# Patient Record
Sex: Female | Born: 1960 | Race: White | Hispanic: No | Marital: Married | Smoking: Former smoker
Health system: Southern US, Community
[De-identification: ages and names within clinical notes are randomized; demographics above are authoritative.]

## PROBLEM LIST (undated history)

## (undated) DIAGNOSIS — F329 Major depressive disorder, single episode, unspecified: Secondary | ICD-10-CM

## (undated) DIAGNOSIS — F32A Depression, unspecified: Secondary | ICD-10-CM

## (undated) DIAGNOSIS — I639 Cerebral infarction, unspecified: Secondary | ICD-10-CM

## (undated) DIAGNOSIS — E785 Hyperlipidemia, unspecified: Secondary | ICD-10-CM

## (undated) DIAGNOSIS — I10 Essential (primary) hypertension: Secondary | ICD-10-CM

## (undated) HISTORY — PX: INCONTINENCE SURGERY: SHX676

## (undated) HISTORY — PX: OTHER SURGICAL HISTORY: SHX169

## (undated) HISTORY — PX: TUBAL LIGATION: SHX77

## (undated) HISTORY — DX: Cerebral infarction, unspecified: I63.9

---

## 1998-07-06 ENCOUNTER — Other Ambulatory Visit: Admission: RE | Admit: 1998-07-06 | Discharge: 1998-07-06 | Payer: Self-pay | Admitting: Obstetrics & Gynecology

## 1998-10-26 ENCOUNTER — Observation Stay (HOSPITAL_COMMUNITY): Admission: RE | Admit: 1998-10-26 | Discharge: 1998-10-27 | Payer: Self-pay | Admitting: Obstetrics & Gynecology

## 1998-10-29 ENCOUNTER — Inpatient Hospital Stay (HOSPITAL_COMMUNITY): Admission: AD | Admit: 1998-10-29 | Discharge: 1998-10-29 | Payer: Self-pay | Admitting: Obstetrics and Gynecology

## 1999-07-07 ENCOUNTER — Other Ambulatory Visit: Admission: RE | Admit: 1999-07-07 | Discharge: 1999-07-07 | Payer: Self-pay | Admitting: Obstetrics & Gynecology

## 2001-02-07 ENCOUNTER — Other Ambulatory Visit: Admission: RE | Admit: 2001-02-07 | Discharge: 2001-02-07 | Payer: Self-pay | Admitting: Obstetrics & Gynecology

## 2002-07-13 ENCOUNTER — Other Ambulatory Visit: Admission: RE | Admit: 2002-07-13 | Discharge: 2002-07-13 | Payer: Self-pay | Admitting: Obstetrics & Gynecology

## 2003-11-08 ENCOUNTER — Ambulatory Visit (HOSPITAL_COMMUNITY): Admission: RE | Admit: 2003-11-08 | Discharge: 2003-11-08 | Payer: Self-pay | Admitting: Family Medicine

## 2003-11-10 ENCOUNTER — Ambulatory Visit (HOSPITAL_COMMUNITY): Admission: RE | Admit: 2003-11-10 | Discharge: 2003-11-10 | Payer: Self-pay | Admitting: Family Medicine

## 2004-02-22 ENCOUNTER — Other Ambulatory Visit: Admission: RE | Admit: 2004-02-22 | Discharge: 2004-02-22 | Payer: Self-pay | Admitting: Obstetrics & Gynecology

## 2011-03-17 ENCOUNTER — Encounter (HOSPITAL_COMMUNITY): Payer: Self-pay | Admitting: Pharmacy Technician

## 2011-03-19 ENCOUNTER — Encounter (HOSPITAL_COMMUNITY)
Admission: RE | Admit: 2011-03-19 | Discharge: 2011-03-19 | Disposition: A | Discharge status: Home / Self Care | Payer: BC Managed Care – PPO | Source: Ambulatory Visit | Attending: Obstetrics & Gynecology | Admitting: Obstetrics & Gynecology

## 2011-03-19 ENCOUNTER — Encounter (HOSPITAL_COMMUNITY): Payer: Self-pay

## 2011-03-19 ENCOUNTER — Other Ambulatory Visit: Payer: Self-pay

## 2011-03-19 HISTORY — DX: Essential (primary) hypertension: I10

## 2011-03-19 HISTORY — DX: Depression, unspecified: F32.A

## 2011-03-19 HISTORY — DX: Major depressive disorder, single episode, unspecified: F32.9

## 2011-03-19 HISTORY — DX: Hyperlipidemia, unspecified: E78.5

## 2011-03-19 LAB — BASIC METABOLIC PANEL
CO2: 25 mEq/L (ref 19–32)
GFR calc non Af Amer: >90 mL/min (ref >90)

## 2011-03-19 LAB — CBC
HCT: 28.7 % — ABNORMAL LOW (ref 36.0–46.0)
Hemoglobin: 9.3 g/dL — ABNORMAL LOW (ref 12.0–15.0)
MCH: 29 pg (ref 26.0–34.0)
MCHC: 32.4 g/dL (ref 30.0–36.0)
MCV: 89.4 fL (ref 78.0–100.0)
Platelets: 229 10*3/uL (ref 150–400)
RBC: 3.21 MIL/uL — ABNORMAL LOW (ref 3.87–5.11)
RDW: 12.7 % (ref 11.5–15.5)
WBC: 3.6 10*3/uL — ABNORMAL LOW (ref 4.0–10.5)

## 2011-03-19 NOTE — H&P (Signed)
Kathleen Rodgers is an 50 y.o. female.   Chief Complaint: Abnormal vaginal bleeding HPI: Patient had abnormal vaginal bleeding in 2010.  Pipelle showed no hyperplasia but fragment of polyp was noted on path.  Her abnormal vaginal bleeding has recurred.  Past Medical History  Diagnosis Date  . Hypertension   . Hyperlipidemia   . Depression     no meds    Past Surgical History  Procedure Date  . Tubal ligation   . Svd     x 3  . Incontinence surgery     No family history on file. Social History:  reports that she quit smoking about 15 years ago. Her smoking use included Cigarettes. She has a 31.5 pack-year smoking history. She has never used smokeless tobacco. She reports that she does not drink alcohol or use illicit drugs.  Allergies: No Known Allergies  No current facility-administered medications on file as of .   No current outpatient prescriptions on file as of .    Results for orders placed during the hospital encounter of 03/19/11 (from the past 48 hour(s))  BASIC METABOLIC PANEL     Status: Normal   Collection Time   03/19/11 11:00 AM      Component Value Range Comment   Sodium 138  135 - 145 (mEq/L)    Potassium 4.0  3.5 - 5.1 (mEq/L)    Chloride 105  96 - 112 (mEq/L)    CO2 25  19 - 32 (mEq/L)    Glucose, Bld 96  70 - 99 (mg/dL)    BUN 11  6 - 23 (mg/dL)    Creatinine, Ser 2.13  0.50 - 1.10 (mg/dL)    Calcium 9.5  8.4 - 10.5 (mg/dL)    GFR calc non Af Amer >90  >90 (mL/min)    GFR calc Af Amer >90  >90 (mL/min)   CBC     Status: Abnormal   Collection Time   03/19/11 11:00 AM      Component Value Range Comment   WBC 3.6 (*) 4.0 - 10.5 (K/uL)    RBC 3.21 (*) 3.87 - 5.11 (MIL/uL)    Hemoglobin 9.3 (*) 12.0 - 15.0 (g/dL)    HCT 08.6 (*) 57.8 - 46.0 (%)    MCV 89.4  78.0 - 100.0 (fL)    MCH 29.0  26.0 - 34.0 (pg)    MCHC 32.4  30.0 - 36.0 (g/dL)    RDW 46.9  62.9 - 52.8 (%)    Platelets 229  150 - 400 (K/uL)    No results found.  ROS: Non  contributory  There were no vitals taken for this visit. Physical Exam Pelvic exam (vagina, vulva, cervix, uterus, adnexa) without abnormality  Ultrasound on 02 February 2011 confirmed normal adnexa, uterine cavity was normal, endometrial strip 6.7 mm, 2.6 cm left lateral myoma noted.  Assessment/Plan Recurrent abnormal perimenopausal bleeding.  Endometrial sample in 2010 suspicious for polyp.  Will proceed with hysteroscopy and D&C.  If polyp present will remove.  If no polyp will proceed with Novasure to treat persistent abnormal vaginal bleeding.    Posey Petrik D 03/19/2011, 3:27 PM

## 2011-03-19 NOTE — Pre-Procedure Instructions (Signed)
OK to see patient DOS 

## 2011-03-19 NOTE — Patient Instructions (Signed)
   Your procedure is scheduled on: Friday, Dec 7th  Enter through the [employer] [facility] at: 10:30am Pick up the phone at the desk and dial [phone] and inform us of your arrival.  Please call this number if you have any problems the morning of surgery: [phone]  Remember: Do not eat food after midnight: Thursday Do not drink clear liquids after: Thursday Take these medicines the morning of surgery with a SIP OF WATER: lisinopil  Do not wear jewelry, make-up, or FINGER nail polish Do not wear lotions, powders, perfumes or deodorant. Do not shave 48 hours prior to surgery. Do not bring valuables to the hospital.  Patients discharged on the day of surgery will not be allowed to drive home.   Home with husband Annice Pih  cell [phone].  Remember to use your hibiclens as instructed.Please shower with 1/2 bottle the evening before your surgery and the other 1/2 bottle the morning of surgery.

## 2011-03-23 ENCOUNTER — Ambulatory Visit (HOSPITAL_COMMUNITY)
Admission: RE | Admit: 2011-03-23 | Discharge: 2011-03-23 | Disposition: A | Discharge status: Home / Self Care | Payer: BC Managed Care – PPO | Source: Ambulatory Visit | Attending: Obstetrics & Gynecology | Admitting: Obstetrics & Gynecology

## 2011-03-23 ENCOUNTER — Encounter (HOSPITAL_COMMUNITY): Payer: Self-pay | Admitting: *Deleted

## 2011-03-23 ENCOUNTER — Encounter (HOSPITAL_COMMUNITY)
Admission: RE | Disposition: A | Discharge status: Home / Self Care | Payer: Self-pay | Source: Ambulatory Visit | Attending: Obstetrics & Gynecology

## 2011-03-23 ENCOUNTER — Other Ambulatory Visit: Payer: Self-pay | Admitting: Obstetrics & Gynecology

## 2011-03-23 ENCOUNTER — Encounter (HOSPITAL_COMMUNITY): Payer: Self-pay | Admitting: Anesthesiology

## 2011-03-23 ENCOUNTER — Ambulatory Visit (HOSPITAL_COMMUNITY): Payer: BC Managed Care – PPO | Admitting: Anesthesiology

## 2011-03-23 DIAGNOSIS — Z01818 Encounter for other preprocedural examination: Secondary | ICD-10-CM | POA: Insufficient documentation

## 2011-03-23 DIAGNOSIS — N949 Unspecified condition associated with female genital organs and menstrual cycle: Secondary | ICD-10-CM | POA: Insufficient documentation

## 2011-03-23 DIAGNOSIS — N938 Other specified abnormal uterine and vaginal bleeding: Secondary | ICD-10-CM | POA: Insufficient documentation

## 2011-03-23 DIAGNOSIS — Z01812 Encounter for preprocedural laboratory examination: Secondary | ICD-10-CM | POA: Insufficient documentation

## 2011-03-23 SURGERY — DILATATION & CURETTAGE/HYSTEROSCOPY WITH NOVASURE ABLATION
Anesthesia: General | Site: Vagina | Wound class: Clean Contaminated

## 2011-03-23 MED ORDER — KETOROLAC TROMETHAMINE 30 MG/ML IJ SOLN: 15.0000 mg | Freq: Once | INTRAMUSCULAR | Status: DC | PRN

## 2011-03-23 MED ORDER — DEXAMETHASONE SODIUM PHOSPHATE 10 MG/ML IJ SOLN
INTRAMUSCULAR | Status: AC
  Filled 2011-03-23: qty 1

## 2011-03-23 MED ORDER — KETOROLAC TROMETHAMINE 30 MG/ML IJ SOLN
INTRAMUSCULAR | Status: DC | PRN
  Administered 2011-03-23: 30 mg via INTRAVENOUS

## 2011-03-23 MED ORDER — ONDANSETRON HCL 4 MG/2ML IJ SOLN
INTRAMUSCULAR | Status: DC | PRN
  Administered 2011-03-23: 4 mg via INTRAVENOUS

## 2011-03-23 MED ORDER — LIDOCAINE HCL 2 % IJ SOLN
INTRAMUSCULAR | Status: DC | PRN
  Administered 2011-03-23: 10 mL

## 2011-03-23 MED ORDER — EPHEDRINE 5 MG/ML INJ
INTRAVENOUS | Status: AC
  Filled 2011-03-23: qty 10

## 2011-03-23 MED ORDER — LIDOCAINE HCL (CARDIAC) 20 MG/ML IV SOLN
INTRAVENOUS | Status: DC | PRN
  Administered 2011-03-23: 60 mg via INTRAVENOUS

## 2011-03-23 MED ORDER — ONDANSETRON HCL 4 MG/2ML IJ SOLN
INTRAMUSCULAR | Status: AC
  Filled 2011-03-23: qty 2

## 2011-03-23 MED ORDER — FENTANYL CITRATE 0.05 MG/ML IJ SOLN
INTRAMUSCULAR | Status: AC
  Filled 2011-03-23: qty 2

## 2011-03-23 MED ORDER — KETOROLAC TROMETHAMINE 60 MG/2ML IM SOLN
INTRAMUSCULAR | Status: DC | PRN
  Administered 2011-03-23: 30 mg via INTRAMUSCULAR

## 2011-03-23 MED ORDER — MIDAZOLAM HCL 2 MG/2ML IJ SOLN
INTRAMUSCULAR | Status: AC
  Filled 2011-03-23: qty 2

## 2011-03-23 MED ORDER — HYDROMORPHONE HCL PF 1 MG/ML IJ SOLN
INTRAMUSCULAR | Status: AC
  Filled 2011-03-23: qty 1

## 2011-03-23 MED ORDER — OXYCODONE-ACETAMINOPHEN 5-325 MG PO TABS: 1.0000 | ORAL_TABLET | ORAL | Status: AC | PRN

## 2011-03-23 MED ORDER — KETOROLAC TROMETHAMINE 60 MG/2ML IM SOLN
INTRAMUSCULAR | Status: AC
Start: 2011-03-23 — End: 2011-03-23
  Filled 2011-03-23: qty 2

## 2011-03-23 MED ORDER — LACTATED RINGERS IV SOLN
INTRAVENOUS | Status: DC | PRN
  Administered 2011-03-23: 3000 mL via INTRAVENOUS

## 2011-03-23 MED ORDER — PROPOFOL 10 MG/ML IV EMUL
INTRAVENOUS | Status: DC | PRN
  Administered 2011-03-23: 50 mg via INTRAVENOUS
  Administered 2011-03-23: 150 mg via INTRAVENOUS

## 2011-03-23 MED ORDER — ONDANSETRON HCL 4 MG/2ML IJ SOLN: 4.0000 mg | Freq: Once | INTRAMUSCULAR | Status: DC | PRN

## 2011-03-23 MED ORDER — EPHEDRINE SULFATE 50 MG/ML IJ SOLN
INTRAMUSCULAR | Status: DC | PRN
  Administered 2011-03-23: 10 mg via INTRAVENOUS

## 2011-03-23 MED ORDER — MEPERIDINE HCL 25 MG/ML IJ SOLN: 6.2500 mg | INTRAMUSCULAR | Status: DC | PRN

## 2011-03-23 MED ORDER — PROPOFOL 10 MG/ML IV EMUL
INTRAVENOUS | Status: AC
  Filled 2011-03-23: qty 20

## 2011-03-23 MED ORDER — LACTATED RINGERS IV SOLN
INTRAVENOUS | Status: DC
  Administered 2011-03-23 (×2): via INTRAVENOUS

## 2011-03-23 MED ORDER — FENTANYL CITRATE 0.05 MG/ML IJ SOLN: 25.0000 ug | INTRAMUSCULAR | Status: DC | PRN

## 2011-03-23 MED ORDER — FENTANYL CITRATE 0.05 MG/ML IJ SOLN
INTRAMUSCULAR | Status: DC | PRN
  Administered 2011-03-23: 100 ug via INTRAVENOUS

## 2011-03-23 MED ORDER — DEXAMETHASONE SODIUM PHOSPHATE 4 MG/ML IJ SOLN
INTRAMUSCULAR | Status: DC | PRN
  Administered 2011-03-23: 10 mg via INTRAVENOUS

## 2011-03-23 MED ORDER — MIDAZOLAM HCL 5 MG/5ML IJ SOLN
INTRAMUSCULAR | Status: DC | PRN
  Administered 2011-03-23: 2 mg via INTRAVENOUS

## 2011-03-23 MED ORDER — LIDOCAINE HCL (CARDIAC) 20 MG/ML IV SOLN
INTRAVENOUS | Status: AC
  Filled 2011-03-23: qty 5

## 2011-03-23 SURGICAL SUPPLY — 13 items
ABLATOR ENDOMETRIAL BIPOLAR (ABLATOR) ×2 IMPLANT
CATH ROBINSON RED A/P 16FR (CATHETERS) ×2 IMPLANT
CLOTH BEACON ORANGE TIMEOUT ST (SAFETY) ×2 IMPLANT
CONTAINER PREFILL 10% NBF 60ML (FORM) IMPLANT
GLOVE ECLIPSE 6.0 STRL STRAW (GLOVE) ×4 IMPLANT
GOWN PREVENTION PLUS LG XLONG (DISPOSABLE) ×4 IMPLANT
GOWN STRL REIN XL XLG (GOWN DISPOSABLE) ×2 IMPLANT
NDL SPNL 22GX3.5 QUINCKE BK (NEEDLE) ×1 IMPLANT
NEEDLE SPNL 22GX3.5 QUINCKE BK (NEEDLE) ×2 IMPLANT
PACK HYSTEROSCOPY LF (CUSTOM PROCEDURE TRAY) ×2 IMPLANT
PAD PREP 24X48 CUFFED NSTRL (MISCELLANEOUS) ×2 IMPLANT
TOWEL OR 17X24 6PK STRL BLUE (TOWEL DISPOSABLE) ×4 IMPLANT
WATER STERILE IRR 1000ML POUR (IV SOLUTION) ×2 IMPLANT

## 2011-03-23 NOTE — Anesthesia Postprocedure Evaluation (Signed)
Anesthesia Post Note  Patient: Kathleen Rodgers  Procedure(s) Performed:  DILATATION & CURETTAGE/HYSTEROSCOPY WITH NOVASURE ABLATION  Anesthesia type: General  Patient location: PACU  Post pain: Pain level controlled  Post assessment: Post-op Vital signs reviewed  Last Vitals:  Filed Vitals:   03/23/11 1300  BP:   Pulse:   Temp:   Resp: 16    Post vital signs: Reviewed  Level of consciousness: sedated  Complications: No apparent anesthesia complicationsfj

## 2011-03-23 NOTE — Transfer of Care (Signed)
Immediate Anesthesia Transfer of Care Note  Patient: Kathleen Rodgers  Procedure(s) Performed:  DILATATION & CURETTAGE/HYSTEROSCOPY WITH NOVASURE ABLATION  Patient Location: PACU  Anesthesia Type: General  Level of Consciousness: awake, alert  and oriented  Airway & Oxygen Therapy: Patient Spontanous Breathing and Patient connected to nasal cannula oxygen  Post-op Assessment: Report given to PACU RN and Post -op Vital signs reviewed and stable  Post vital signs: Reviewed and stable  Complications: No apparent anesthesia complications

## 2011-03-23 NOTE — Anesthesia Preprocedure Evaluation (Signed)
Anesthesia Evaluation  Patient identified by MRN, date of birth, ID band Patient awake    Reviewed: Allergy & Precautions, H&P , NPO status , Patient's Chart, lab work & pertinent test results  Airway Mallampati: I TM Distance: >3 FB Neck ROM: full    Dental No notable dental hx. (+) Teeth Intact   Pulmonary neg pulmonary ROS,    Pulmonary exam normal       Cardiovascular hypertension, Pt. on medications     Neuro/Psych PSYCHIATRIC DISORDERS Depression Negative Neurological ROS     GI/Hepatic negative GI ROS, Neg liver ROS,   Endo/Other  Negative Endocrine ROS  Renal/GU negative Renal ROS  Genitourinary negative   Musculoskeletal negative musculoskeletal ROS (+)   Abdominal Normal abdominal exam  (+)   Peds negative pediatric ROS (+)  Hematology negative hematology ROS (+)   Anesthesia Other Findings   Reproductive/Obstetrics negative OB ROS                           Anesthesia Physical Anesthesia Plan  ASA: II  Anesthesia Plan: General   Post-op Pain Management:    Induction: Intravenous  Airway Management Planned: LMA  Additional Equipment:   Intra-op Plan:   Post-operative Plan: Extubation in OR  Informed Consent: I have reviewed the patients History and Physical, chart, labs and discussed the procedure including the risks, benefits and alternatives for the proposed anesthesia with the patient or authorized representative who has indicated his/her understanding and acceptance.     Plan Discussed with: CRNA  Anesthesia Plan Comments:         Anesthesia Quick Evaluation

## 2011-03-23 NOTE — Anesthesia Procedure Notes (Signed)
Procedure Name: LMA Insertion Date/Time: 03/23/2011 12:19 PM Performed by: Karleen Dolphin Pre-anesthesia Checklist: Patient identified, Patient being monitored, Emergency Drugs available, Timeout performed and Suction available Patient Re-evaluated:Patient Re-evaluated prior to inductionOxygen Delivery Method: Circle System Utilized Preoxygenation: Pre-oxygenation with 100% oxygen Intubation Type: IV induction Ventilation: Mask ventilation without difficulty LMA: LMA inserted LMA Size: 4.0 Number of attempts: 1 Placement Confirmation: positive ETCO2 and breath sounds checked- equal and bilateral Tube secured with: Tape Dental Injury: Teeth and Oropharynx as per pre-operative assessment

## 2011-03-23 NOTE — Progress Notes (Signed)
I have interviewed and performed the pertinent exams on my patient to confirm that there have been no significant changes in her condition since the dictation of her history and physical exam.  

## 2011-03-23 NOTE — Op Note (Signed)
Patient Name: Kathleen Rodgers MRN: [id number]  Date of Surgery: 03/23/2011    PREOPERATIVE DIAGNOSIS: ABNORMAL UTERINE BLEEDING  POSTOPERATIVE DIAGNOSIS: Abnormal uterine bleeding, no gross endometrial pathology   PROCEDURE: Hysteroscopy, D&C, Novasure endometrial ablation  SURGEON: Caralyn Guile. Arlyce Dice M.D.  ANESTHESIA: General  ESTIMATED BLOOD LOSS: * No blood loss amount entered *  FINDINGS: Uterus was retroverted.  Cervix sounds 3.4 cm, Uterus sounds 9.0 cm.  No gross pathology (myoma, polyps) noted.  Moderate amount of endometrial currettings.   INDICATIONS: Recurrent abnormal perimenopausal bleeding.  PROCEDURE IN DETAIL: The patient was taken to the OR and placed in the dors-lithotomy position. The perineum and vagina were prepped and draped in a sterile fashion. The bladder was emptied.  Bimanual exam revealed a top normal sized retroverted uterus.  The cervix and uterus were sounded and the cavity depth was determined to be 5.5 cm.  The internal cervical os was dilated with Shawnie Pons dilators to 21 [place].  The diagnostic hysteroscope was introduced and the cavity was inspected.  A sharp curettage was performed and the tissue was sent to pathology.  The internal os was dilated further to 25 [place] and the Novasure device was inserted and deployed to a width of 4.5 cm.  Ablation time was 120 sec. At a power of 136.  The hysteroscope was reintroduced and the cavity was intact and well cauterized.  The procedure was terminated and the patient left the OR in good condition.

## 2011-12-31 ENCOUNTER — Other Ambulatory Visit: Payer: Self-pay

## 2012-07-16 ENCOUNTER — Telehealth: Payer: Self-pay | Admitting: Physician Assistant

## 2012-07-17 ENCOUNTER — Telehealth: Payer: Self-pay | Admitting: *Deleted

## 2012-07-17 NOTE — Telephone Encounter (Signed)
Pt had testing for Oxygen levels by [facility] but has not received any information from [facility] concerning results. Please call with results.

## 2012-07-17 NOTE — Telephone Encounter (Signed)
Pt will call [facility] and have them fax results of oxygen test to [place] for review.

## 2012-07-18 ENCOUNTER — Telehealth: Payer: Self-pay | Admitting: Physician Assistant

## 2012-07-22 ENCOUNTER — Other Ambulatory Visit: Payer: Self-pay | Admitting: *Deleted

## 2012-07-22 ENCOUNTER — Other Ambulatory Visit: Payer: Self-pay | Admitting: Nurse Practitioner

## 2012-07-22 DIAGNOSIS — R0902 Hypoxemia: Secondary | ICD-10-CM

## 2012-07-22 NOTE — Telephone Encounter (Signed)
Pt aware , Kathleen Rodgers and Kathleen Rodgers are addressing the orders necessary at [facility].

## 2012-07-29 ENCOUNTER — Encounter: Payer: Self-pay | Admitting: Family Medicine

## 2012-08-08 ENCOUNTER — Other Ambulatory Visit: Payer: Self-pay | Admitting: *Deleted

## 2012-08-08 MED ORDER — TELMISARTAN 40 MG PO TABS: 40.0000 mg | ORAL_TABLET | Freq: Every day | ORAL | Status: DC

## 2012-08-12 ENCOUNTER — Other Ambulatory Visit: Payer: Self-pay | Admitting: Nurse Practitioner

## 2012-08-12 DIAGNOSIS — G4710 Hypersomnia, unspecified: Secondary | ICD-10-CM

## 2012-08-12 NOTE — Progress Notes (Signed)
Pt aware of sleep apnea test being set up

## 2012-08-21 ENCOUNTER — Ambulatory Visit: Payer: BC Managed Care – PPO | Attending: Family Medicine | Admitting: Sleep Medicine

## 2012-08-21 VITALS — Ht 67.0 in | Wt 173.0 lb

## 2012-08-21 DIAGNOSIS — Z6827 Body mass index (BMI) 27.0-27.9, adult: Secondary | ICD-10-CM | POA: Insufficient documentation

## 2012-08-21 DIAGNOSIS — G4710 Hypersomnia, unspecified: Secondary | ICD-10-CM | POA: Insufficient documentation

## 2012-08-25 NOTE — Procedures (Signed)
[facility] Parveen Freehling A. Gerilyn Pilgrim, MD     [email or web address]        NAMEJAKEISHA, STRICKER               ACCOUNT NO.:  [id number]  MEDICAL RECORD NO.:  [id number]          PATIENT TYPE:  OUT  LOCATION:  SLEEP LAB                     FACILITY:  [facility]  PHYSICIAN:  Kenya Kook A. Gerilyn Pilgrim, M.D. DATE OF BIRTH:  1960/11/21  DATE OF STUDY:  08/21/2012                           NOCTURNAL POLYSOMNOGRAM  REFERRING PHYSICIAN:  Ernestina Penna, M.D.  INDICATIONS:  A 52 year old who presents with difficulty waking, hypersomnia, and daytime sleepiness.  MEDICATIONS:  Crestor and alprazolam.  EPWORTH SLEEPINESS SCALE:  8.  BMI 27.  ARCHITECTURAL SUMMARY:  The total recording time is 364 minutes.  Sleep efficiency 74%, sleep latency 18 minutes.  REM latency 97 minutes. Stage N1 8%, N2 69%, N3 7%, and REM sleep 16%.  RESPIRATORY SUMMARY:  Baseline oxygen saturation is 97, lowest saturation 92 during non-REM sleep.  Diagnostic AHI is 2 and RDI 5.  LIMB MOVEMENT SUMMARY:  PLM index 4.  ELECTROCARDIOGRAM SUMMARY:  Average heart rate is 58 with no significant dysrhythmias observed.  IMPRESSION:  Unremarkable nocturnal polysomnography.  Thanks for this referral.    Paton Crum A. Gerilyn Pilgrim, M.D.    KAD/MEDQ  D:  08/25/2012 09:29:09  T:  08/25/2012 09:52:44  Job:  [id number]

## 2012-09-18 NOTE — Telephone Encounter (Signed)
Yes received.

## 2012-11-05 ENCOUNTER — Other Ambulatory Visit: Payer: Self-pay | Admitting: Nurse Practitioner

## 2012-11-07 ENCOUNTER — Other Ambulatory Visit: Payer: Self-pay | Admitting: *Deleted

## 2012-11-07 MED ORDER — ROSUVASTATIN CALCIUM 10 MG PO TABS: 10.0000 mg | ORAL_TABLET | Freq: Every day | ORAL | Status: DC

## 2013-02-10 ENCOUNTER — Other Ambulatory Visit (INDEPENDENT_AMBULATORY_CARE_PROVIDER_SITE_OTHER): Payer: BC Managed Care – PPO | Admitting: Family Medicine

## 2013-02-10 ENCOUNTER — Encounter (INDEPENDENT_AMBULATORY_CARE_PROVIDER_SITE_OTHER): Payer: Self-pay

## 2013-02-10 ENCOUNTER — Encounter: Payer: Self-pay | Admitting: Family Medicine

## 2013-02-10 VITALS — BP 140/88 | HR 71 | Temp 97.6°F | Ht 66.0 in | Wt 181.6 lb

## 2013-02-10 DIAGNOSIS — Z79899 Other long term (current) drug therapy: Secondary | ICD-10-CM

## 2013-02-10 DIAGNOSIS — E785 Hyperlipidemia, unspecified: Secondary | ICD-10-CM

## 2013-02-10 MED ORDER — TELMISARTAN 40 MG PO TABS: 40.0000 mg | ORAL_TABLET | Freq: Every day | ORAL | Status: DC

## 2013-02-10 NOTE — Addendum Note (Signed)
Addended by: Deatra Canter on: 02/10/2013 10:24 AM   Modules accepted: Orders

## 2013-02-10 NOTE — Progress Notes (Signed)
Pt saw andrew but now willing to see Nils Pyle

## 2013-02-10 NOTE — Addendum Note (Signed)
Addended by: Bearl Mulberry on: 02/10/2013 09:53 AM   Modules accepted: Orders, Medications

## 2013-02-10 NOTE — Progress Notes (Signed)
  Subjective:    Patient ID: Kathleen Rodgers, female    DOB: 1961/02/09, 52 y.o.   MRN: [id number]  HPI This 52 y.o. female presents for evaluation of hypertension and hyperlipidemia. She is out of her meds and needs refills..   Review of Systems No chest pain, SOB, HA, dizziness, vision change, N/V, diarrhea, constipation, dysuria, urinary urgency or frequency, myalgias, arthralgias or rash.     Objective:   Physical Exam  Vital signs noted  Well developed well nourished female.  HEENT - Head atraumatic Normocephalic                Eyes - PERRLA, Conjuctiva - clear Sclera- Clear EOMI                Ears - EAC's Wnl TM's Wnl Gross Hearing WNL                Nose - Nares patent                 Throat - oropharanx wnl Respiratory - Lungs CTA bilateral Cardiac - RRR S1 and S2 without murmur GI - Abdomen soft Nontender and bowel sounds active x 4 Extremities - No edema. Neuro - Grossly intact.      Assessment & Plan:  Hypertension - renew Valsartan 40mg  po qd  Hyperlipidemia - renew crestor and follow up in 6 months  Deatra Canter FNP

## 2013-02-10 NOTE — Progress Notes (Signed)
  Subjective:    Patient ID: LEILANA MCQUIRE, female    DOB: [birth year], 52 y.o.   MRN: [id number]  HPI This 52 y.o. female presents for evaluation of hypertension.   Review of Systems No chest pain, SOB, HA, dizziness, vision change, N/V, diarrhea, constipation, dysuria, urinary urgency or frequency, myalgias, arthralgias or rash.     Objective:   Physical Exam Vital signs noted  Well developed well nourished female.  HEENT - Head atraumatic Normocephalic                Eyes - PERRLA, Conjuctiva - clear Sclera- Clear EOMI                Ears - EAC's Wnl TM's Wnl Gross Hearing WNL                Nose - Nares patent                 Throat - oropharanx wnl Respiratory - Lungs CTA bilateral Cardiac - RRR S1 and S2 without murmur GI - Abdomen soft Nontender and bowel sounds active x 4 Extremities - No edema. Neuro - Grossly intact.       Assessment & Plan:

## 2013-02-11 ENCOUNTER — Ambulatory Visit: Payer: PRIVATE HEALTH INSURANCE | Admitting: Family Medicine

## 2013-02-12 LAB — CMP14+EGFR
ALT: 19 IU/L (ref 0–32)
AST: 15 IU/L (ref 0–40)
Albumin/Globulin Ratio: 1.6 (ref 1.1–2.5)
Albumin: 4.1 g/dL (ref 3.5–5.5)
Alkaline Phosphatase: 88 IU/L (ref 39–117)
BUN/Creatinine Ratio: 18 (ref 9–23)
BUN: 14 mg/dL (ref 6–24)
CO2: 21 mmol/L (ref 18–29)
Calcium: 9.2 mg/dL (ref 8.7–10.2)
Chloride: 102 mmol/L (ref 97–108)
Creatinine, Ser: 0.78 mg/dL (ref 0.57–1.00)
GFR calc Af Amer: 100 mL/min/{1.73_m2} (ref >59)
GFR calc non Af Amer: 86 mL/min/{1.73_m2} (ref >59)
Globulin, Total: 2.5 g/dL (ref 1.5–4.5)
Glucose: 96 mg/dL (ref 65–99)
Potassium: 3.9 mmol/L (ref 3.5–5.2)
Sodium: 141 mmol/L (ref 134–144)
Total Bilirubin: 0.3 mg/dL (ref 0.0–1.2)
Total Protein: 6.6 g/dL (ref 6.0–8.5)

## 2013-02-12 LAB — NMR, LIPOPROFILE
Cholesterol: 232 mg/dL — ABNORMAL HIGH (ref <200)
HDL Cholesterol by NMR: 41 mg/dL (ref >=40)
HDL Particle Number: 26.5 umol/L — ABNORMAL LOW (ref >=30.5)
LDL Particle Number: 2118 nmol/L — ABNORMAL HIGH (ref <1000)
LDL Size: 20.1 nm — ABNORMAL LOW (ref >20.5)
LDLC SERPL CALC-MCNC: 159 mg/dL — ABNORMAL HIGH (ref <100)
LP-IR Score: 62 — ABNORMAL HIGH (ref <=45)
Small LDL Particle Number: 1405 nmol/L — ABNORMAL HIGH (ref <=527)
Triglycerides by NMR: 162 mg/dL — ABNORMAL HIGH (ref <150)

## 2013-02-20 ENCOUNTER — Telehealth: Payer: Self-pay | Admitting: Family Medicine

## 2013-02-20 NOTE — Telephone Encounter (Signed)
ALREADY TAKEN CARE OF

## 2013-02-21 ENCOUNTER — Other Ambulatory Visit: Payer: Self-pay | Admitting: Family Medicine

## 2013-02-23 ENCOUNTER — Other Ambulatory Visit: Payer: Self-pay | Admitting: Family Medicine

## 2013-02-23 MED ORDER — ROSUVASTATIN CALCIUM 10 MG PO TABS: 10.0000 mg | ORAL_TABLET | Freq: Every day | ORAL | Status: DC

## 2013-04-21 ENCOUNTER — Encounter: Payer: Self-pay | Admitting: Family Medicine

## 2013-04-21 ENCOUNTER — Ambulatory Visit (INDEPENDENT_AMBULATORY_CARE_PROVIDER_SITE_OTHER): Payer: BC Managed Care – PPO | Admitting: Family Medicine

## 2013-04-21 VITALS — BP 112/74 | HR 73 | Temp 97.8°F | Wt 178.6 lb

## 2013-04-21 DIAGNOSIS — R21 Rash and other nonspecific skin eruption: Secondary | ICD-10-CM

## 2013-04-21 DIAGNOSIS — Z1211 Encounter for screening for malignant neoplasm of colon: Secondary | ICD-10-CM

## 2013-04-21 MED ORDER — METHYLPREDNISOLONE ACETATE 80 MG/ML IJ SUSP: 80.0000 mg | Freq: Once | INTRAMUSCULAR | Status: DC

## 2013-04-21 MED ORDER — HYDROXYZINE HCL 25 MG PO TABS: 25.0000 mg | ORAL_TABLET | Freq: Three times a day (TID) | ORAL | Status: DC | PRN

## 2013-04-21 MED ORDER — METHYLPREDNISOLONE (PAK) 4 MG PO TABS: ORAL_TABLET | ORAL | Status: DC

## 2013-04-21 NOTE — Patient Instructions (Signed)

## 2013-04-21 NOTE — Progress Notes (Signed)
   Subjective:    Patient ID: Kathleen Rodgers, female    DOB: October 25, 1960, [age] y.o.   MRN: [id number]  HPI This 53 y.o. female presents for evaluation of rash on abdomen and chest.  She is not Sure what she may be allergic to and she has not started any new medications.  She is due For colonoscopy and she is requesting referral for colonoscopy.  Review of Systems    No chest pain, SOB, HA, dizziness, vision change, N/V, diarrhea, constipation, dysuria, urinary urgency or frequency, myalgias, arthralgias or rash.  Objective:   Physical Exam Vital signs noted  Well developed well nourished female.  HEENT - Head atraumatic Normocephalic                Eyes - PERRLA, Conjuctiva - clear Sclera- Clear EOMI                Ears - EAC's Wnl TM's Wnl Gross Hearing WNL                Throat - oropharanx wnl Respiratory - Lungs CTA bilateral Cardiac - RRR S1 and S2 without murmur GI - Abdomen soft Nontender and bowel sounds active x 4 Extremities - No edema. Neuro - Grossly intact. Skin - Macular papular rash on abdomen and right chest.      Assessment & Plan:  Rash and nonspecific skin eruption - Plan: methylPREDNIsolone (MEDROL DOSPACK) 4 MG tablet, hydrOXYzine (ATARAX/VISTARIL) 25 MG tablet, methylPREDNISolone acetate (DEPO-MEDROL) injection 80 mg  Special screening for malignant neoplasms, colon - Plan: Ambulatory referral to Gastroenterology  Deatra CanterWilliam J Oxford FNP

## 2013-05-15 ENCOUNTER — Encounter: Payer: Self-pay | Admitting: Gastroenterology

## 2013-05-27 ENCOUNTER — Other Ambulatory Visit: Payer: Self-pay | Admitting: Gastroenterology

## 2013-06-08 ENCOUNTER — Encounter: Payer: BC Managed Care – PPO | Admitting: Gastroenterology

## 2013-06-10 ENCOUNTER — Encounter (HOSPITAL_COMMUNITY): Payer: Self-pay | Admitting: Pharmacy Technician

## 2013-06-11 ENCOUNTER — Encounter (HOSPITAL_COMMUNITY): Payer: Self-pay | Admitting: *Deleted

## 2013-06-12 ENCOUNTER — Telehealth: Payer: Self-pay | Admitting: Family Medicine

## 2013-06-12 NOTE — Telephone Encounter (Signed)
Patient noticed that her urine looks more yellow than normal. Denies pain or frequency. Denies any change in medications or vitamin use. She is leaving at 7:00 in the morning and will be out of the state for a couple of weeks.   There are no appts available this afternoon and patient wouldn't be able to come in tomorrow morning.  Suggested she increase her water intake and seek care at an urgent care if additional symptoms develop. Patient agreed to plan.

## 2013-06-29 ENCOUNTER — Other Ambulatory Visit: Payer: Self-pay | Admitting: Gastroenterology

## 2013-06-29 ENCOUNTER — Telehealth: Payer: Self-pay | Admitting: Family Medicine

## 2013-06-29 NOTE — Telephone Encounter (Signed)
Appt given for wed with Ander SladeBill Oxford

## 2013-06-30 ENCOUNTER — Ambulatory Visit (HOSPITAL_COMMUNITY)
Admission: RE | Admit: 2013-06-30 | Payer: BC Managed Care – PPO | Source: Ambulatory Visit | Admitting: Gastroenterology

## 2013-06-30 SURGERY — COLONOSCOPY WITH PROPOFOL
Anesthesia: Monitor Anesthesia Care

## 2013-07-01 ENCOUNTER — Ambulatory Visit (INDEPENDENT_AMBULATORY_CARE_PROVIDER_SITE_OTHER): Payer: BC Managed Care – PPO | Admitting: Family Medicine

## 2013-07-01 ENCOUNTER — Telehealth: Payer: Self-pay | Admitting: Family Medicine

## 2013-07-01 ENCOUNTER — Encounter: Payer: Self-pay | Admitting: Family Medicine

## 2013-07-01 VITALS — BP 114/75 | HR 70 | Temp 98.5°F | Ht 67.0 in | Wt 179.0 lb

## 2013-07-01 DIAGNOSIS — R82998 Other abnormal findings in urine: Secondary | ICD-10-CM

## 2013-07-01 DIAGNOSIS — I639 Cerebral infarction, unspecified: Secondary | ICD-10-CM

## 2013-07-01 DIAGNOSIS — R002 Palpitations: Secondary | ICD-10-CM

## 2013-07-01 DIAGNOSIS — I4891 Unspecified atrial fibrillation: Secondary | ICD-10-CM

## 2013-07-01 DIAGNOSIS — R8290 Unspecified abnormal findings in urine: Secondary | ICD-10-CM

## 2013-07-01 DIAGNOSIS — I480 Paroxysmal atrial fibrillation: Secondary | ICD-10-CM

## 2013-07-01 DIAGNOSIS — I6350 Cerebral infarction due to unspecified occlusion or stenosis of unspecified cerebral artery: Secondary | ICD-10-CM

## 2013-07-01 LAB — POCT URINALYSIS DIPSTICK
Bilirubin, UA: NEGATIVE
Glucose, UA: NEGATIVE
Ketones, UA: NEGATIVE
Nitrite, UA: NEGATIVE
Protein, UA: NEGATIVE
Spec Grav, UA: 1.01
Urobilinogen, UA: NEGATIVE
pH, UA: 5

## 2013-07-01 LAB — POCT UA - MICROSCOPIC ONLY
Casts, Ur, LPF, POC: NEGATIVE
Crystals, Ur, HPF, POC: NEGATIVE
Mucus, UA: NEGATIVE
RBC, urine, microscopic: NEGATIVE
Yeast, UA: NEGATIVE

## 2013-07-01 NOTE — Progress Notes (Signed)
   Subjective:    Patient ID: Kathleen Rodgers, female    DOB: 23-Feb-1961, 53 y.o.   MRN: [id number]  HPI This 53 y.o. female presents for evaluation of follow up from [place] in [place] due To CVA.  [facility] was [facility] [place].  She suffered TIA and she states it was due to Paroxysmal Atrial fibrillation.  She had ct scan which was negative for acute and Had nonspecific punctate T2 flair hyperintensities withingthe right frontal deep white matter, which could represent very mild chronic small vessel disease.  No acute ischemic infarct or hemorrhage. Echocardiogram was normal LV function and 60% EF.  She has EKG which shows 9 beat run of Paroxysmal atrial fibrillation.  She has no physical deficits such as hemniopsia or hemniplegia but Does c/o some mild expressive aphasia at times which she states seems to be getting better. She was not recommended to see speech therapy.   Review of Systems No chest pain, SOB, HA, dizziness, vision change, N/V, diarrhea, constipation, dysuria, urinary urgency or frequency, myalgias, arthralgias or rash.     Objective:   Physical Exam Vital signs noted  Well developed well nourished female.  HEENT - Head atraumatic Normocephalic                Eyes - PERRLA, Conjuctiva - clear Sclera- Clear EOMI                Ears - EAC's Wnl TM's Wnl Gross Hearing WNL                Nose - Nares patent                 Throat - oropharanx wnl Respiratory - Lungs CTA bilateral Cardiac - RRR S1 and S2 without murmur GI - Abdomen soft Nontender and bowel sounds active x 4 Extremities - No edema. Neuro - Grossly intact.       Assessment & Plan:  Foul smelling urine - Plan: POCT urinalysis dipstick, POCT UA - Microscopic Only, Ambulatory referral to Cardiology  Palpitations - Plan: Cardiac event monitor, Ambulatory referral to Cardiology  Paroxysmal atrial fibrillation - Plan: Ambulatory referral to Cardiology.  Continue  Pradaxa   CVA (cerebral infarction) - Plan: Ambulatory referral to Cardiology.  Consider Speech Therapy consult if expressive problems persist  Deatra CanterWilliam J Oxford FNP

## 2013-07-02 ENCOUNTER — Encounter: Payer: Self-pay | Admitting: Internal Medicine

## 2013-07-02 ENCOUNTER — Ambulatory Visit (INDEPENDENT_AMBULATORY_CARE_PROVIDER_SITE_OTHER): Payer: BC Managed Care – PPO | Admitting: Internal Medicine

## 2013-07-02 VITALS — BP 140/70 | HR 65 | Ht 66.0 in | Wt 180.7 lb

## 2013-07-02 DIAGNOSIS — I10 Essential (primary) hypertension: Secondary | ICD-10-CM

## 2013-07-02 DIAGNOSIS — I6350 Cerebral infarction due to unspecified occlusion or stenosis of unspecified cerebral artery: Secondary | ICD-10-CM

## 2013-07-02 DIAGNOSIS — I4891 Unspecified atrial fibrillation: Secondary | ICD-10-CM

## 2013-07-02 DIAGNOSIS — Q2111 Secundum atrial septal defect: Secondary | ICD-10-CM

## 2013-07-02 DIAGNOSIS — Q2112 Patent foramen ovale: Secondary | ICD-10-CM

## 2013-07-02 DIAGNOSIS — Q211 Atrial septal defect: Secondary | ICD-10-CM

## 2013-07-02 DIAGNOSIS — I639 Cerebral infarction, unspecified: Secondary | ICD-10-CM

## 2013-07-02 NOTE — Patient Instructions (Addendum)
Your physician has requested that you have a lexiscan stress myoview. For further information please visit [email or web address]. Please follow instruction sheet, as given.  We will call you with the results.   Your physician wants you to follow-up in: 1 year. You will receive a reminder letter in the mail two months in advance. If you don't receive a letter, please call our office to schedule the follow-up appointment.

## 2013-07-03 ENCOUNTER — Encounter: Payer: Self-pay | Admitting: Internal Medicine

## 2013-07-03 DIAGNOSIS — I10 Essential (primary) hypertension: Secondary | ICD-10-CM | POA: Insufficient documentation

## 2013-07-03 DIAGNOSIS — Q211 Atrial septal defect: Secondary | ICD-10-CM | POA: Insufficient documentation

## 2013-07-03 DIAGNOSIS — I4891 Unspecified atrial fibrillation: Secondary | ICD-10-CM | POA: Insufficient documentation

## 2013-07-03 DIAGNOSIS — Z8673 Personal history of transient ischemic attack (TIA), and cerebral infarction without residual deficits: Secondary | ICD-10-CM | POA: Insufficient documentation

## 2013-07-03 DIAGNOSIS — Q2112 Patent foramen ovale: Secondary | ICD-10-CM | POA: Insufficient documentation

## 2013-07-03 NOTE — Progress Notes (Signed)
OFFICE NOTE  Chief Complaint:  Hospital follow-up  Primary Care Physician: Deatra Canter, FNP  HPI:  Kathleen Rodgers is a pleasant 53 year old female who unfortunately recently suffered a stroke when she was visiting her son who was in [place]. She developed acute onset left-sided weakness and trouble speaking and presented to emergently to a local hospital. She was then rapidly transferred to a larger hospital, apparently by which time her symptoms had improved. She did not receive TPA. She underwent a thorough workup including carotid Dopplers, head CT and MRI, echocardiogram and TEE. She was also found to have a short period of atrial fibrillation on telemetry and her TEE was negative for thrombus. 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 Her venous Dopplers were performed indicating no evidence of DVT.  She was placed on projects along with other rate controlling medications and has done fairly well.  She does have a compelling family history for heart disease as her mother had her first urge activities 54 and her grandfather had a stroke. She is currently asymptomatic and has regained basically full recovery.  PMHx:  Past Medical History  Diagnosis Date  . Hypertension   . Hyperlipidemia   . Depression     no meds    Past Surgical History  Procedure Laterality Date  . Tubal ligation    . Svd      x 3  . Incontinence surgery      FAMHx:  Family History  Problem Relation Age of Onset  . Heart disease Mother   . Heart attack Mother 16  . Throat cancer Father   . Breast cancer Sister 30    SOCHx:   reports that she quit smoking about 18 years ago. Her smoking use included Cigarettes. She has a 31.5 pack-year smoking history. She has never used smokeless tobacco. She reports that she does not drink alcohol or use illicit drugs.  ALLERGIES:  No Known Allergies  ROS: A comprehensive review of systems was negative.  HOME  MEDS: Current Outpatient Prescriptions  Medication Sig Dispense Refill  . dabigatran (PRADAXA) 150 MG CAPS capsule Take 150 mg by mouth 2 (two) times daily.      . metoprolol tartrate (LOPRESSOR) 25 MG tablet 12.5 mg 2 (two) times daily.       Marland Kitchen telmisartan (MICARDIS) 40 MG tablet Take 40 mg by mouth every morning.      . rosuvastatin (CRESTOR) 10 MG tablet Take 10 mg by mouth every evening.       Current Facility-Administered Medications  Medication Dose Route Frequency Provider Last Rate Last Dose  . methylPREDNISolone acetate (DEPO-MEDROL) injection 80 mg  80 mg Intramuscular Once Deatra Canter, FNP        LABS/IMAGING: No results found for this or any previous visit (from the past 48 hour(s)). No results found.  VITALS: BP 140/70  Pulse 65  Ht 5\' 6"  (1.676 m)  Wt 180 lb 11.2 oz (81.965 kg)  BMI 29.18 kg/m2  LMP 02/10/2013  EXAM: General appearance: alert and no distress Neck: no carotid bruit and no JVD Lungs: clear to auscultation bilaterally Heart: regular rate and rhythm, S1, S2 normal, no murmur, click, rub or gallop Abdomen: soft, non-tender; bowel sounds normal; no masses,  no organomegaly Extremities: extremities normal, atraumatic, no cyanosis or edema Pulses: 2+ and symmetric Skin: Skin color, texture, turgor normal. No rashes or lesions Neurologic: Grossly normal Psych: Mood, affect normal  EKG: Normal  sinus rhythm at 65  ASSESSMENT: 1. Cardioembolic stroke 2. Paroxysmal atrial fibrillation 3. CHADSVASC score of 5, on Pradaxa 4. Hypertension-controlled 5. Dyslipidemia- on crestor  PLAN: 1.   Kathleen Rodgers is fortunate to have regained recovery of motor and speech after her recent stroke. She was found to have paroxysmal atrial fibrillation and is appropriately anticoagulated on Pradaxa. She is in sinus rhythm and blood pressure is well-controlled on current rate controlling and blood pressure medications. She takes Crestor for dyslipidemia which is  appropriate. Given the recency of her stroke, she did not undergo an ischemia evaluation. There is a strong family history of coronary disease and I feel that this would be important to rule out. Will arrange for LexiScan nuclear stress test.  Plan to contact her with the results of that study.  Chrystie NoseKenneth C. Hilty, MD, [facility] HospitalFACC Attending Cardiologist [facility]  HILTY,Kenneth C 07/03/2013, 5:38 PM

## 2013-07-06 ENCOUNTER — Other Ambulatory Visit (HOSPITAL_COMMUNITY): Payer: Self-pay | Admitting: Internal Medicine

## 2013-07-06 DIAGNOSIS — G459 Transient cerebral ischemic attack, unspecified: Secondary | ICD-10-CM

## 2013-07-06 DIAGNOSIS — I4891 Unspecified atrial fibrillation: Secondary | ICD-10-CM

## 2013-07-16 ENCOUNTER — Ambulatory Visit (HOSPITAL_COMMUNITY)
Admission: RE | Admit: 2013-07-16 | Discharge: 2013-07-16 | Disposition: A | Discharge status: Home / Self Care | Payer: BC Managed Care – PPO | Source: Ambulatory Visit | Attending: Cardiovascular Disease | Admitting: Cardiovascular Disease

## 2013-07-16 DIAGNOSIS — G459 Transient cerebral ischemic attack, unspecified: Secondary | ICD-10-CM

## 2013-07-16 DIAGNOSIS — I4891 Unspecified atrial fibrillation: Secondary | ICD-10-CM

## 2013-07-16 DIAGNOSIS — Q2111 Secundum atrial septal defect: Secondary | ICD-10-CM | POA: Insufficient documentation

## 2013-07-16 DIAGNOSIS — Q211 Atrial septal defect: Secondary | ICD-10-CM | POA: Insufficient documentation

## 2013-07-16 MED ORDER — TECHNETIUM TC 99M SESTAMIBI GENERIC - CARDIOLITE
30.0000 | Freq: Once | INTRAVENOUS | Status: AC | PRN
  Administered 2013-07-16: 30 via INTRAVENOUS

## 2013-07-16 MED ORDER — TECHNETIUM TC 99M SESTAMIBI GENERIC - CARDIOLITE
9.9000 | Freq: Once | INTRAVENOUS | Status: AC | PRN
  Administered 2013-07-16: 10 via INTRAVENOUS

## 2013-07-16 NOTE — Procedures (Addendum)
[facility] [address] [place] [place] [address] [place] [address] [phone]  Cardiology Nuclear Med Study  Kathleen Rodgers is a 53 y.o. female     MRN : [id number]     DOB: March 07, 1961  Procedure Date: 07/16/2013  Nuclear Med Background Indication for Stress Test:  Evaluation for Ischemia and Post Hospital History:  PAF;PFO;ECHO in 2015-EF=60%;No prior NUC study for comparrison. Cardiac Risk Factors: Family History - CAD, History of Smoking, Hypertension, Lipids, Overweight and TIA  Symptoms:  Fatigue   Nuclear Pre-Procedure Caffeine/Decaff Intake:  9:00pm NPO After: 7:00am   IV Site: R Forearm  IV 0.9% NS with Angio Cath:  22g  Chest Size (in):  n/a IV Started by: Emmit Pomfret, RN  Height: 5\' 6"  (1.676 m)  Cup Size: D  BMI:  Body mass index is 29.07 kg/(m^2). Weight:  180 lb (81.647 kg)   Tech Comments:  n/a    Nuclear Med Study 1 or 2 day study: 1 day  Stress Test Type:  Stress  Order Authorizing Provider:  Zoila Shutter, MD   Resting Radionuclide: Technetium 10m Sestamibi  Resting Radionuclide Dose: 9.9 mCi   Stress Radionuclide:  Technetium 49m Sestamibi  Stress Radionuclide Dose: 30.0 mCi           Stress Protocol Rest HR: 64 Stress HR: 153  Rest BP: 122/90 Stress BP: 133/103  Exercise Time (min): 8:30 METS: 10.1   Predicted Max HR: 165 bpm % Max HR: 92.73 bpm Rate Pressure Product: 78469  Dose of Adenosine (mg):  n/a Dose of Lexiscan: n/a mg  Dose of Atropine (mg): n/a Dose of Dobutamine: n/a mcg/kg/min (at max HR)  Stress Test Technologist: Esperanza Sheets, CCT Nuclear Technologist: Gonzella Lex, CNMT   Rest Procedure:  Myocardial perfusion imaging was performed at rest 45 minutes following the intravenous administration of Technetium 74m Sestamibi. Stress Procedure:  The patient performed treadmill exercise using a Bruce  Protocol for 8:30 minutes. The patient stopped due to SOB, Fatigue and leg discomfort and denied  any chest pain.  There were no significant ST-T wave changes.  Technetium 53m Sestamibi was injected IV at peak exercise and myocardial perfusion imaging was performed after a brief delay.  Transient Ischemic Dilatation (Normal <1.22):  0.78 Lung/Heart Ratio (Normal <0.45):  0.29 QGS EDV:  73 ml QGS ESV:  20 ml LV Ejection Fraction: 73%      Rest ECG: NSR - Normal EKG  Stress ECG: Significant ST abnormalities consistent with ischemia. There is horizontal 1 mm ST depression in the inferior leads, < 1 mm in V5-V6.  QPS Raw Data Images:  Mild breast attenuation.  Normal left ventricular size. Stress Images:  There is decreased uptake in the apex. Rest Images:  Comparison with the stress images reveals no significant change. Subtraction (SDS):  There is a fixed apical defect that is most consistent with breast attenuation. LV Wall Motion:  NL LV Function; NL Wall Motion  Impression Exercise Capacity:  Good exercise capacity. BP Response:  Hypertensive blood pressure response. Clinical Symptoms:  No significant symptoms noted. ECG Impression:  Borderline significant ST abnormalities suggestive of ischemia. Comparison with Prior Nuclear Study: No previous nuclear study performed   Overall Impression:  Low risk stress nuclear study with a fixed apical defect that is most consistent with attenuation artifact. No reversible defects are seen.  Borderline abnormal ECG response is noted - consider "false positive" ECG study. Consider possible "balanced ischemia", but this appears less likely.  Thurmon FairROITORU,Kathleen Bangs, MD  07/16/2013 4:55 PM

## 2013-07-23 ENCOUNTER — Telehealth: Payer: Self-pay | Admitting: Internal Medicine

## 2013-07-23 ENCOUNTER — Other Ambulatory Visit: Payer: Self-pay | Admitting: *Deleted

## 2013-07-23 MED ORDER — DABIGATRAN ETEXILATE MESYLATE 150 MG PO CAPS: 150.0000 mg | ORAL_CAPSULE | Freq: Two times a day (BID) | ORAL | Status: DC

## 2013-07-23 NOTE — Telephone Encounter (Signed)
First would love to speak to someone about her test results .. Second [facility] a [facility] [place] [facility] [place] . She was getting samples and is a needing a prescription .Marland Kitchen. Please call if you have any questions .[facility] Kitchen.   Thanks

## 2013-07-23 NOTE — Telephone Encounter (Signed)
Nuclear study results given.  Samples of Pradaxa 150mg  # 36 given.  She will price the Pradaxa at local pharmacies and let us know where to call it in.

## 2013-07-27 ENCOUNTER — Telehealth: Payer: Self-pay | Admitting: Internal Medicine

## 2013-07-27 NOTE — Telephone Encounter (Signed)
Cindy at a dental office called.  Stated pt is there for a procedure and dentist wanted to know if pt has any precautions b/c pt stated she had a stroke recently.  Informed Arline AspCindy that requests of this nature should be given to the office several days before pt seen as our providers are not in the office every day.  Informed RN will have to notify a provider to review her chart for further instructions and also that pt should follow up with neurology if she has had a stroke.  Verbalized understanding and understands RN will have to call her back as pt's cardiologist is out of the office today.  Dr. Herbie BaltimoreHarding (DOD) notified and advised pt has a bleeding risk r/t Pradaxa, but does not need Abx.  [facility] and informed.  Verbalized understanding.

## 2013-08-31 ENCOUNTER — Other Ambulatory Visit: Payer: Self-pay | Admitting: Family Medicine

## 2013-09-21 ENCOUNTER — Other Ambulatory Visit: Payer: Self-pay | Admitting: Family Medicine

## 2013-09-22 NOTE — Telephone Encounter (Signed)
Patient last seen on 07-01-13. Last labs done 01-31-14. Was to return in 6 months. Please advise on 90 day refill

## 2013-10-12 ENCOUNTER — Telehealth: Payer: Self-pay | Admitting: Internal Medicine

## 2013-10-12 MED ORDER — DABIGATRAN ETEXILATE MESYLATE 150 MG PO CAPS: 150.0000 mg | ORAL_CAPSULE | Freq: Two times a day (BID) | ORAL | Status: DC

## 2013-10-12 NOTE — Telephone Encounter (Signed)
Patient would like samples of Pradaxa.

## 2013-10-12 NOTE — Telephone Encounter (Signed)
Samples given -- 18 tablet  Patient states her daughter will pick up samples Patient will like prescription sent to pharmacy  E sent prescription

## 2013-11-02 ENCOUNTER — Telehealth: Payer: Self-pay | Admitting: Internal Medicine

## 2013-11-02 MED ORDER — DABIGATRAN ETEXILATE MESYLATE 150 MG PO CAPS: 150.0000 mg | ORAL_CAPSULE | Freq: Two times a day (BID) | ORAL | Status: DC

## 2013-11-02 NOTE — Telephone Encounter (Signed)
Spoke with pt, aware have resent the script.

## 2013-11-02 NOTE — Telephone Encounter (Signed)
Dr. Rennis GoldenHilty was to call in Pradaxa. [facility] [place] [facility] and [facility]'s not there.  Almost out of samples.

## 2013-11-06 ENCOUNTER — Telehealth: Payer: Self-pay | Admitting: Internal Medicine

## 2013-11-06 MED ORDER — DABIGATRAN ETEXILATE MESYLATE 150 MG PO CAPS
150.0000 mg | ORAL_CAPSULE | Freq: Two times a day (BID) | ORAL | Status: DC
Start: 2013-11-06 — End: 2014-07-14

## 2013-11-06 NOTE — Telephone Encounter (Signed)
Pt is completely out of her Pradaxa,need it this weekend. Pharmacist said they have been trying to get this 2 times this week.[phone].

## 2013-11-06 NOTE — Telephone Encounter (Signed)
Rx was sent to pharmacy electronically. Patient notified.  

## 2013-11-19 ENCOUNTER — Other Ambulatory Visit: Payer: Self-pay | Admitting: Family Medicine

## 2014-02-17 ENCOUNTER — Other Ambulatory Visit: Payer: Self-pay | Admitting: Family Medicine

## 2014-03-13 ENCOUNTER — Other Ambulatory Visit: Payer: Self-pay | Admitting: Family Medicine

## 2014-04-20 ENCOUNTER — Other Ambulatory Visit: Payer: Self-pay | Admitting: Family Medicine

## 2014-05-19 ENCOUNTER — Other Ambulatory Visit: Payer: Self-pay | Admitting: Family Medicine

## 2014-05-19 NOTE — Telephone Encounter (Signed)
Last seen 07/01/13  B [place]

## 2014-07-14 ENCOUNTER — Ambulatory Visit (INDEPENDENT_AMBULATORY_CARE_PROVIDER_SITE_OTHER): Payer: BLUE CROSS/BLUE SHIELD | Admitting: Family

## 2014-07-14 ENCOUNTER — Encounter: Payer: Self-pay | Admitting: Family

## 2014-07-14 VITALS — BP 110/73 | HR 72 | Temp 97.0°F | Ht 66.0 in | Wt 186.2 lb

## 2014-07-14 DIAGNOSIS — Z1321 Encounter for screening for nutritional disorder: Secondary | ICD-10-CM

## 2014-07-14 DIAGNOSIS — Z8673 Personal history of transient ischemic attack (TIA), and cerebral infarction without residual deficits: Secondary | ICD-10-CM | POA: Diagnosis not present

## 2014-07-14 DIAGNOSIS — I4891 Unspecified atrial fibrillation: Secondary | ICD-10-CM | POA: Diagnosis not present

## 2014-07-14 DIAGNOSIS — E785 Hyperlipidemia, unspecified: Secondary | ICD-10-CM | POA: Diagnosis not present

## 2014-07-14 DIAGNOSIS — Z1211 Encounter for screening for malignant neoplasm of colon: Secondary | ICD-10-CM

## 2014-07-14 DIAGNOSIS — I10 Essential (primary) hypertension: Secondary | ICD-10-CM | POA: Diagnosis not present

## 2014-07-14 DIAGNOSIS — R635 Abnormal weight gain: Secondary | ICD-10-CM

## 2014-07-14 DIAGNOSIS — R0602 Shortness of breath: Secondary | ICD-10-CM

## 2014-07-14 MED ORDER — DABIGATRAN ETEXILATE MESYLATE 150 MG PO CAPS: 150.0000 mg | ORAL_CAPSULE | Freq: Two times a day (BID) | ORAL | Status: DC

## 2014-07-14 MED ORDER — TELMISARTAN 40 MG PO TABS: ORAL_TABLET | ORAL | Status: DC

## 2014-07-14 MED ORDER — ROSUVASTATIN CALCIUM 10 MG PO TABS: 10.0000 mg | ORAL_TABLET | Freq: Every evening | ORAL | Status: DC

## 2014-07-14 NOTE — Progress Notes (Signed)
Subjective:    Patient ID: Kathleen Rodgers, female    DOB: 1961-01-30, 53 y.o.   MRN: [id number]  Hypertension This is a chronic problem. The current episode started more than 1 year ago. The problem has been resolved since onset. The problem is controlled. Associated symptoms include shortness of breath ("at times"). Pertinent negatives include no anxiety, headaches, palpitations or peripheral edema. Risk factors for coronary artery disease include dyslipidemia, obesity, post-menopausal state and family history. Past treatments include angiotensin blockers. The current treatment provides significant improvement. There is no history of kidney disease, CAD/MI, CVA, heart failure or a thyroid problem. There is no history of sleep apnea.  Hyperlipidemia This is a chronic problem. The current episode started more than 1 year ago. The problem is uncontrolled. Recent lipid tests were reviewed and are high. She has no history of diabetes or hypothyroidism. Associated symptoms include shortness of breath ("at times"). Pertinent negatives include no leg pain. Current antihyperlipidemic treatment includes statins (Pt states she has been out for a few weeks). The current treatment provides significant improvement of lipids. Risk factors for coronary artery disease include dyslipidemia, family history, hypertension, obesity and post-menopausal.      Review of Systems  Constitutional: Negative.   HENT: Negative.   Eyes: Negative.   Respiratory: Positive for shortness of breath ("at times").   Cardiovascular: Negative.  Negative for palpitations.  Gastrointestinal: Negative.   Endocrine: Negative.   Genitourinary: Negative.   Musculoskeletal: Negative.   Neurological: Negative.  Negative for headaches.  Hematological: Negative.   Psychiatric/Behavioral: Negative.   All other systems reviewed and are negative.      Objective:   Physical Exam  Constitutional: She is oriented to person, place, and  time. She appears well-developed and well-nourished. No distress.  HENT:  Head: Normocephalic and atraumatic.  Right Ear: External ear normal.  Left Ear: External ear normal.  Nose: Nose normal.  Mouth/Throat: Oropharynx is clear and moist.  Eyes: Pupils are equal, round, and reactive to light.  Neck: Normal range of motion. Neck supple. No thyromegaly present.  Cardiovascular: Normal rate, regular rhythm, normal heart sounds and intact distal pulses.   No murmur heard. Pulmonary/Chest: Effort normal and breath sounds normal. No respiratory distress. She has no wheezes.  Abdominal: Soft. Bowel sounds are normal. She exhibits no distension. There is no tenderness.  Musculoskeletal: Normal range of motion. She exhibits no edema or tenderness.  Neurological: She is alert and oriented to person, place, and time. She has normal reflexes. No cranial nerve deficit.  Skin: Skin is warm and dry.  Psychiatric: She has a normal mood and affect. Her behavior is normal. Judgment and thought content normal.  Vitals reviewed.     BP 110/73 mmHg  Pulse 72  Temp(Src) 97 F (36.1 C) (Oral)  Ht 5' 6"  (1.676 m)  Wt 186 lb 3.2 oz (84.46 kg)  BMI 30.07 kg/m2  LMP 02/10/2013     Assessment & Plan:  1. Essential hypertension - CMP14+EGFR - telmisartan (MICARDIS) 40 MG tablet; TAKE 1 TABLET (40 MG TOTAL) BY MOUTH DAILY.  Dispense: 90 tablet; Refill: 3  2. History of stroke - CMP14+EGFR - Lipid panel - dabigatran (PRADAXA) 150 MG CAPS capsule; Take 1 capsule (150 mg total) by mouth 2 (two) times daily.  Dispense: 180 capsule; Refill: 4  3. Atrial fibrillation, unspecified - CMP14+EGFR - dabigatran (PRADAXA) 150 MG CAPS capsule; Take 1 capsule (150 mg total) by mouth 2 (two) times daily.  Dispense: 180 capsule;  Refill: 4  4. Hyperlipidemia LDL goal <70  - CMP14+EGFR - Lipid panel - rosuvastatin (CRESTOR) 10 MG tablet; Take 1 tablet (10 mg total) by mouth every evening.  Dispense: 90 tablet;  Refill: 3  5. Encounter for screening colonoscopy - CMP14+EGFR - Ambulatory referral to Gastroenterology  6. SOB (shortness of breath) - CMP14+EGFR - Thyroid Panel With TSH  7. Weight gain - CMP14+EGFR - Thyroid Panel With TSH  8. Encounter for vitamin deficiency screening - CMP14+EGFR - Vit D  25 hydroxy (rtn osteoporosis monitoring)   Continue all meds Labs pending Health Maintenance reviewed Diet and exercise encouraged RTO 6 months  Evelina Dun, FNP

## 2014-07-14 NOTE — Patient Instructions (Signed)

## 2014-07-15 ENCOUNTER — Other Ambulatory Visit: Payer: Self-pay | Admitting: Family

## 2014-07-15 DIAGNOSIS — E559 Vitamin D deficiency, unspecified: Secondary | ICD-10-CM | POA: Insufficient documentation

## 2014-07-15 LAB — LIPID PANEL
CHOL/HDL RATIO: 6.1 ratio — AB (ref 0.0–4.4)
Cholesterol, Total: 238 mg/dL — ABNORMAL HIGH (ref 100–199)
HDL: 39 mg/dL — ABNORMAL LOW (ref >39)
LDL Calculated: 158 mg/dL — ABNORMAL HIGH (ref 0–99)
Triglycerides: 206 mg/dL — ABNORMAL HIGH (ref 0–149)
VLDL Cholesterol Cal: 41 mg/dL — ABNORMAL HIGH (ref 5–40)

## 2014-07-15 LAB — CMP14+EGFR
ALBUMIN: 4.4 g/dL (ref 3.5–5.5)
ALK PHOS: 97 IU/L (ref 39–117)
ALT: 30 IU/L (ref 0–32)
AST: 27 IU/L (ref 0–40)
Albumin/Globulin Ratio: 1.6 (ref 1.1–2.5)
BUN/Creatinine Ratio: 12 (ref 9–23)
BUN: 10 mg/dL (ref 6–24)
Bilirubin Total: 0.6 mg/dL (ref 0.0–1.2)
CALCIUM: 9.5 mg/dL (ref 8.7–10.2)
CHLORIDE: 104 mmol/L (ref 97–108)
CO2: 23 mmol/L (ref 18–29)
Creatinine, Ser: 0.84 mg/dL (ref 0.57–1.00)
GFR calc Af Amer: 90 mL/min/{1.73_m2} (ref >59)
GFR calc non Af Amer: 78 mL/min/{1.73_m2} (ref >59)
GLOBULIN, TOTAL: 2.7 g/dL (ref 1.5–4.5)
GLUCOSE: 108 mg/dL — AB (ref 65–99)
Potassium: 4.3 mmol/L (ref 3.5–5.2)
Sodium: 141 mmol/L (ref 134–144)
TOTAL PROTEIN: 7.1 g/dL (ref 6.0–8.5)

## 2014-07-15 LAB — THYROID PANEL WITH TSH
Free Thyroxine Index: 2.1 (ref 1.2–4.9)
T3 Uptake Ratio: 29 % (ref 24–39)
T4, Total: 7.1 ug/dL (ref 4.5–12.0)
TSH: 1.59 u[IU]/mL (ref 0.450–4.500)

## 2014-07-15 LAB — VITAMIN D 25 HYDROXY (VIT D DEFICIENCY, FRACTURES): Vit D, 25-Hydroxy: 24.6 ng/mL — ABNORMAL LOW (ref 30.0–100.0)

## 2014-07-15 MED ORDER — VITAMIN D (ERGOCALCIFEROL) 1.25 MG (50000 UNIT) PO CAPS
50000.0000 [IU] | ORAL_CAPSULE | ORAL | Status: DC
Start: 2014-07-15 — End: 2015-09-02

## 2014-07-16 ENCOUNTER — Telehealth: Payer: Self-pay | Admitting: *Deleted

## 2014-07-16 NOTE — Telephone Encounter (Signed)
-----   Message from Junie Spencerhristy A Hawks, FNP sent at 07/15/2014 10:26 AM EDT ----- Kidney and liver function stable Cholesterol and Triglycerides elevated- Pt needs to start taking her Crestor Thyroid levels WNL Vit D levels low- Prescription sent to pharmacy

## 2014-07-16 NOTE — Telephone Encounter (Signed)
Pt aware of labs & has already picked up Rx's

## 2014-07-16 NOTE — Telephone Encounter (Signed)
Pt not available at this time, will call back.

## 2014-07-26 ENCOUNTER — Encounter: Payer: Self-pay | Admitting: Internal Medicine

## 2014-09-01 ENCOUNTER — Encounter: Payer: Self-pay | Admitting: Nurse Practitioner

## 2014-09-06 ENCOUNTER — Encounter: Payer: Self-pay | Admitting: Internal Medicine

## 2014-09-06 ENCOUNTER — Ambulatory Visit (INDEPENDENT_AMBULATORY_CARE_PROVIDER_SITE_OTHER): Payer: BLUE CROSS/BLUE SHIELD | Admitting: Internal Medicine

## 2014-09-06 VITALS — BP 140/90 | HR 76 | Ht 66.0 in | Wt 187.7 lb

## 2014-09-06 DIAGNOSIS — I10 Essential (primary) hypertension: Secondary | ICD-10-CM | POA: Diagnosis not present

## 2014-09-06 DIAGNOSIS — I480 Paroxysmal atrial fibrillation: Secondary | ICD-10-CM | POA: Diagnosis not present

## 2014-09-06 DIAGNOSIS — E785 Hyperlipidemia, unspecified: Secondary | ICD-10-CM | POA: Diagnosis not present

## 2014-09-06 DIAGNOSIS — Z8673 Personal history of transient ischemic attack (TIA), and cerebral infarction without residual deficits: Secondary | ICD-10-CM | POA: Diagnosis not present

## 2014-09-06 NOTE — Progress Notes (Signed)
OFFICE NOTE  Chief Complaint:  Annual follow-up, mild shortness of breath and depression  Primary Care Physician: Junie Spencer, FNP  HPI:  Kathleen Rodgers is a pleasant 54 year old female who unfortunately recently suffered a stroke when she was visiting her son who was in [place]. She developed acute onset left-sided weakness and trouble speaking and presented to emergently to a local hospital. She was then rapidly transferred to a larger hospital, apparently by which time her symptoms had improved. She did not receive TPA. She underwent a thorough workup including carotid Dopplers, head CT and MRI, echocardiogram and TEE. She was also found to have a short period of atrial fibrillation on telemetry and her TEE was negative for thrombus. 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 Her venous Dopplers were performed indicating no evidence of DVT.  She was placed on projects along with other rate controlling medications and has done fairly well.  She does have a compelling family history for heart disease as her mother had her first urge activities 68 and her grandfather had a stroke. She is currently asymptomatic and has regained basically full recovery.  I had the pleasure of seeing Kathleen Rodgers back in the office today. Since I last saw her she stopped her metoprolol due to complaints of fatigue and reports that it is improved slightly. She's had no further atrial fibrillation. She does get some shortness of breath with exertion but also has had weight gain. She reports some depression and lack of interest in exercise. She is not particularly active. She is having no problems on Pradaxa with regards to bleeding. She recently stopped her Crestor for unknown reasons and a lipid profile showed a marked elevation in cholesterol with total cholesterol 238 and LDL of 158. I agree with restarting her Crestor and she will likely require a higher dose aced on a goal LDL less  than 70 and aggressive therapy given her history of stroke.  PMHx:  Past Medical History  Diagnosis Date  . Hypertension   . Hyperlipidemia   . Depression     no meds    Past Surgical History  Procedure Laterality Date  . Tubal ligation    . Svd      x 3  . Incontinence surgery      FAMHx:  Family History  Problem Relation Age of Onset  . Heart disease Mother   . Heart attack Mother 80  . Throat cancer Father   . Breast cancer Sister 19    SOCHx:   reports that she quit smoking about 19 years ago. Her smoking use included Cigarettes. She has a 31.5 pack-year smoking history. She has never used smokeless tobacco. She reports that she does not drink alcohol or use illicit drugs.  ALLERGIES:  No Known Allergies  ROS: A comprehensive review of systems was negative except for: Constitutional: positive for fatigue Behavioral/Psych: positive for depression  HOME MEDS: Current Outpatient Prescriptions  Medication Sig Dispense Refill  . dabigatran (PRADAXA) 150 MG CAPS capsule Take 1 capsule (150 mg total) by mouth 2 (two) times daily. 180 capsule 4  . rosuvastatin (CRESTOR) 10 MG tablet Take 1 tablet (10 mg total) by mouth every evening. 90 tablet 3  . telmisartan (MICARDIS) 40 MG tablet TAKE 1 TABLET (40 MG TOTAL) BY MOUTH DAILY. 90 tablet 3  . Vitamin D, Ergocalciferol, (DRISDOL) 50000 UNITS CAPS capsule Take 1 capsule (50,000 Units total) by mouth every 7 (seven) days.  12 capsule 3   No current facility-administered medications for this visit.    LABS/IMAGING: No results found for this or any previous visit (from the past 48 hour(s)). No results found.  VITALS: BP 140/90 mmHg  Pulse 76  Ht 5\' 6"  (1.676 m)  Wt 187 lb 11.2 oz (85.14 kg)  BMI 30.31 kg/m2  LMP 02/10/2013  EXAM: General appearance: alert and no distress Neck: no carotid bruit and no JVD Lungs: clear to auscultation bilaterally Heart: regular rate and rhythm, S1, S2 normal, no murmur, click, rub  or gallop Abdomen: soft, non-tender; bowel sounds normal; no masses,  no organomegaly Extremities: extremities normal, atraumatic, no cyanosis or edema Pulses: 2+ and symmetric Skin: Skin color, texture, turgor normal. No rashes or lesions Neurologic: Grossly normal Psych: Mood, affect normal  EKG: Normal sinus rhythm at 76  ASSESSMENT: 1. Cardioembolic stroke 2. Paroxysmal atrial fibrillation 3. CHADSVASC score of 5, on Pradaxa 4. Hypertension-controlled 5. Dyslipidemia- on crestor 6. Depression  PLAN: 1.   Kathleen Rodgers has not had recurrence of A. fib that she is aware of over the past year. She does have a high CHADSVASC score 5 and is on Pradaxa. Blood pressure is high normal and she did stop her metoprolol. Ultimately she may need medication to try to lower blood pressure and one would consider something like verapamil which could give dual benefit with heart rate lowering and blood pressure benefits. This is because of intolerance to beta blocker. Her cholesterol is markedly elevated and she will likely require higher dose than Crestor she can tolerate it. She is reporting depression but I feel that this could benefit from exercise and weight loss and more activity. I'll defer to her primary care provider regarding this. Plan to see her back annually or sooner as necessary.  Chrystie NoseKenneth C. Osama Coleson, MD, [facility] Attending Cardiologist [facility]  Chrystie NoseKenneth C Twanda Stakes 09/06/2014, 12:43 PM

## 2014-09-06 NOTE — Patient Instructions (Addendum)
Your physician wants you to follow-up in: 1 year with Dr. Rennis GoldenHilty. You will receive a reminder letter in the mail two months in advance. If you don't receive a letter, please call our office to schedule the follow-up appointment.  Samples: Pradaxa 150mg  - 4 boxes - LOT: [id number] - Exp: 08/2016

## 2014-09-15 ENCOUNTER — Encounter: Payer: Self-pay | Admitting: Nurse Practitioner

## 2014-09-15 ENCOUNTER — Telehealth: Payer: Self-pay

## 2014-09-15 ENCOUNTER — Ambulatory Visit (INDEPENDENT_AMBULATORY_CARE_PROVIDER_SITE_OTHER): Payer: BLUE CROSS/BLUE SHIELD | Admitting: Nurse Practitioner

## 2014-09-15 VITALS — BP 132/78 | HR 79 | Ht 66.0 in | Wt 188.4 lb

## 2014-09-15 DIAGNOSIS — Z1211 Encounter for screening for malignant neoplasm of colon: Secondary | ICD-10-CM

## 2014-09-15 DIAGNOSIS — Z7902 Long term (current) use of antithrombotics/antiplatelets: Secondary | ICD-10-CM | POA: Diagnosis not present

## 2014-09-15 MED ORDER — NA SULFATE-K SULFATE-MG SULF 17.5-3.13-1.6 GM/177ML PO SOLN: 1.0000 | Freq: Once | ORAL | Status: DC

## 2014-09-15 NOTE — Telephone Encounter (Signed)
Ok to hold pradaxa for 2 days prior to colonoscopy. Resume 24 hours after.  Dr. Rennis GoldenHilty

## 2014-09-15 NOTE — Patient Instructions (Signed)
You have been scheduled for a colonoscopy. Please follow written instructions given to you at your visit today.  Please pick up your prep supplies at the pharmacy within the next 1-3 days. If you use inhalers (even only as needed), please bring them with you on the day of your procedure. Your physician has requested that you go to www.startemmi.com and enter the access code given to you at your visit today. This web site gives a general overview about your procedure. However, you should still follow specific instructions given to you by our office regarding your preparation for the procedure.  You will be contacted by our office prior to your procedure for directions on holding your Pradaxa.  If you do not hear from our office 1 week prior to your scheduled procedure, please call [phone] to discuss.   

## 2014-09-15 NOTE — Telephone Encounter (Signed)
  09/15/2014   RE: Leavy CellaSandra E Kesling DOB: October 07, 1960 MRN: [id number]   Dear Dr Rennis GoldenHilty,    We have scheduled the above patient for an endoscopic procedure. Our records show that she is on anticoagulation therapy.   Please advise as to how long the patient may come off her therapy of Pradaxa prior to the procedure.  Please fax back/ or route the completed form to Ryeleigh Santore at [phone].   Sincerely,    Chales AbrahamsPatty Lewis CMA(AAMA)

## 2014-09-15 NOTE — Telephone Encounter (Signed)
The patient has been notified of this information and all questions answered.

## 2014-09-15 NOTE — Progress Notes (Signed)
HPI :   Kathleen Rodgers is a 54-year-old female referred by PCP for evaluation colon cancer screening. She is on Pradaxa after suffering a CVA in March.  Workup for etiology revealed new patent foramen ovale and also new onset atrial fibrillation. She is followed by [facility].   Kathleen Rodgers has no gastric intestinal complaints. Specifically, no abdominal pain, bowel change, blood in stool, or unexplained weight loss. Family history colon cancer.  Past Medical History  Diagnosis Date  . Hypertension   . Hyperlipidemia   . Depression     no meds  . Stroke     march 2015    Family History  Problem Relation Age of Onset  . Heart disease Mother   . Heart attack Mother 53  . Throat cancer Father   . Breast cancer Sister 40  . Breast cancer Other     neice   History  Substance Use Topics  . Smoking status: Former Smoker -- 1.50 packs/day for 21 years    Types: Cigarettes    Quit date: 04/18/1995  . Smokeless tobacco: Never Used  . Alcohol Use: No   Current Outpatient Prescriptions  Medication Sig Dispense Refill  . dabigatran (PRADAXA) 150 MG CAPS capsule Take 1 capsule (150 mg total) by mouth 2 (two) times daily. 180 capsule 4  . rosuvastatin (CRESTOR) 10 MG tablet Take 1 tablet (10 mg total) by mouth every evening. 90 tablet 3  . telmisartan (MICARDIS) 40 MG tablet TAKE 1 TABLET (40 MG TOTAL) BY MOUTH DAILY. 90 tablet 3  . Vitamin D, Ergocalciferol, (DRISDOL) 50000 UNITS CAPS capsule Take 1 capsule (50,000 Units total) by mouth every 7 (seven) days. 12 capsule 3  . Na Sulfate-K Sulfate-Mg Sulf SOLN Take 1 kit by mouth once. 354 mL 0   No current facility-administered medications for this visit.   No Known Allergies   Review of Systems: All systems reviewed and negative except where noted in HPI.   Physical Exam: BP 132/78 mmHg  Pulse 79  Ht 5' 6" (1.676 m)  Wt 188 lb 6 oz (85.446 kg)  BMI 30.42 kg/m2  LMP 02/10/2013 Constitutional: Pleasant,well-developed, white  female in no acute distress. HEENT: Normocephalic and atraumatic. Conjunctivae are normal. No scleral icterus. Neck supple.  Cardiovascular: Normal rate, regular rhythm.  Pulmonary/chest: Effort normal and breath sounds normal. No wheezing, rales or rhonchi. Abdominal: Soft, nondistended, nontender. Bowel sounds active throughout. There are no masses palpable. No hepatomegaly. Extremities: no edema Lymphadenopathy: No cervical adenopathy noted. Neurological: Alert and oriented to person place and time. Skin: Skin is warm and dry. No rashes noted. Psychiatric: Normal mood and affect. Behavior is normal.   ASSESSMENT AND PLAN:  54-year-old female referred by PCP for evaluation colon cancer screening. Kathleen Rodgers suffered a CVA March. She was found to have new onset atrial fibrillation and is taking Pradaxa. Explained to Kathleen Rodgers that there is an increased risk of CVA off Pradaxa.  Kathleen Rodgers will hold Pradaxa 2 days before procedure - will instruct when and how to resume after procedure. Will communicate by phone or EMR with Kathleen Rodgers's prescribing provider that to confirm holding Pradaxa is reasonable in this case.   Cc: Christy Hawks, FNP       

## 2014-09-15 NOTE — Progress Notes (Signed)
Reviewed and agree with management plan. Pt suffered a stroke in March 2015.  Kathleen LickMalcolm T. Russella DarStark, MD [facility]

## 2014-09-16 ENCOUNTER — Encounter: Payer: Self-pay | Admitting: Internal Medicine

## 2014-11-16 ENCOUNTER — Ambulatory Visit (AMBULATORY_SURGERY_CENTER): Payer: BLUE CROSS/BLUE SHIELD | Admitting: Gastroenterology

## 2014-11-16 ENCOUNTER — Encounter: Payer: Self-pay | Admitting: Gastroenterology

## 2014-11-16 VITALS — BP 124/75 | HR 66 | Temp 97.7°F | Resp 14 | Ht 66.0 in | Wt 188.0 lb

## 2014-11-16 DIAGNOSIS — Z1211 Encounter for screening for malignant neoplasm of colon: Secondary | ICD-10-CM

## 2014-11-16 MED ORDER — SODIUM CHLORIDE 0.9 % IV SOLN: 500.0000 mL | INTRAVENOUS | Status: DC

## 2014-11-16 NOTE — Progress Notes (Signed)
Report to PACU, RN, vss, BBS= Clear.  

## 2014-11-16 NOTE — Patient Instructions (Signed)
YOU HAD AN ENDOSCOPIC PROCEDURE TODAY AT THE [facility]:   Refer to the procedure report that was given to you for any specific questions about what was found during the examination.  If the procedure report does not answer your questions, please call your gastroenterologist to clarify.  If you requested that your care partner not be given the details of your procedure findings, then the procedure report has been included in a sealed envelope for you to review at your convenience later.  YOU SHOULD EXPECT: Some feelings of bloating in the abdomen. Passage of more gas than usual.  Walking can help get rid of the air that was put into your GI tract during the procedure and reduce the bloating. If you had a lower endoscopy (such as a colonoscopy or flexible sigmoidoscopy) you may notice spotting of blood in your stool or on the toilet paper. If you underwent a bowel prep for your procedure, you may not have a normal bowel movement for a few days.  Please Note:  You might notice some irritation and congestion in your nose or some drainage.  This is from the oxygen used during your procedure.  There is no need for concern and it should clear up in a day or so.  SYMPTOMS TO REPORT IMMEDIATELY:   Following lower endoscopy (colonoscopy or flexible sigmoidoscopy):  Excessive amounts of blood in the stool  Significant tenderness or worsening of abdominal pains  Swelling of the abdomen that is new, acute  Fever of 100F or higher    For urgent or emergent issues, a gastroenterologist can be reached at any hour by calling [phone].   DIET: Your first meal following the procedure should be a small meal and then it is ok to progress to your normal diet. Heavy or fried foods are harder to digest and may make you feel nauseous or bloated.  Likewise, meals heavy in dairy and vegetables can increase bloating.  Drink plenty of fluids but you should avoid alcoholic beverages for 24  hours.  ACTIVITY:  You should plan to take it easy for the rest of today and you should NOT DRIVE or use heavy machinery until tomorrow (because of the sedation medicines used during the test).    FOLLOW UP: Our staff will call the number listed on your records the next business day following your procedure to check on you and address any questions or concerns that you may have regarding the information given to you following your procedure. If we do not reach you, we will leave a message.  However, if you are feeling well and you are not experiencing any problems, there is no need to return our call.  We will assume that you have returned to your regular daily activities without incident.  If any biopsies were taken you will be contacted by phone or by letter within the next 1-3 weeks.  Please call us at [phone] if you have not heard about the biopsies in 3 weeks.    SIGNATURES/CONFIDENTIALITY: You and/or your care partner have signed paperwork which will be entered into your electronic medical record.  These signatures attest to the fact that that the information above on your After Visit Summary has been reviewed and is understood.  Full responsibility of the confidentiality of this discharge information lies with you and/or your care-partner.   Resume medications. Information given on diverticulosis and high fiber diet. 

## 2014-11-16 NOTE — Op Note (Signed)
[facility] [address] [place], [address]   COLONOSCOPY PROCEDURE REPORT  PATIENT: Kathleen Rodgers, Kathleen Rodgers  MR#: [id number] BIRTHDATE: July 20, 1960 , 54  yrs. old GENDER: female ENDOSCOPIST: Meryl Dare, MD, [facility] REFERRED OZ:DGUYQIH Lendon Colonel, FNP PROCEDURE DATE:  11/16/2014 PROCEDURE:   Colonoscopy, screening First Screening Colonoscopy - Avg.  risk and is 50 yrs.  old or older Yes.  Prior Negative Screening - Now for repeat screening. N/A  History of Adenoma - Now for follow-up colonoscopy & has been > or = to 3 yrs.  N/A  Polyps removed today? No Recommend repeat exam, <10 yrs? No ASA CLASS:   Class III INDICATIONS:Screening for colonic neoplasia and Colorectal Neoplasm Risk Assessment for this procedure is average risk. MEDICATIONS: Monitored anesthesia care and Propofol 300 mg IV DESCRIPTION OF PROCEDURE:   After the risks benefits and alternatives of the procedure were thoroughly explained, informed consent was obtained.  The digital rectal exam revealed no abnormalities of the rectum.   The LB [id number]  endoscope was introduced through the anus and advanced to the cecum, which was identified by both the appendix and ileocecal valve. No adverse events experienced.   The quality of the prep was excellent. (Suprep was used)  The instrument was then slowly withdrawn as the colon was fully examined. Estimated blood loss is zero unless otherwise noted in this procedure report.    COLON FINDINGS: There was moderate diverticulosis noted in the sigmoid colon with associated luminal narrowing and muscular hypertrophy.   The examination was otherwise normal.  Retroflexed views revealed internal Grade I hemorrhoids. The time to cecum = 4.9 Withdrawal time = 13.1   The scope was withdrawn and the procedure completed. COMPLICATIONS: There were no immediate complications.  ENDOSCOPIC IMPRESSION: 1.   Moderate diverticulosis in the sigmoid colon 2.   Grade l  internal hemorrhoids  RECOMMENDATIONS: 1.  High fiber diet with liberal fluid intake. 2.  Continue current colorectal screening recommendations for "routine risk" patients with a repeat colonoscopy in 10 years. 3.  Resume Pradaxa tomorrow  eSigned:  Meryl Dare, MD, [facility] 11/16/2014 8:28 AM

## 2014-11-17 ENCOUNTER — Telehealth: Payer: Self-pay | Admitting: *Deleted

## 2014-11-17 NOTE — Telephone Encounter (Signed)
  Follow up Call-  Call back number 11/16/2014  Post procedure Call Back phone  # [phone]  Permission to leave phone message Yes     Patient questions:  Do you have a fever, pain , or abdominal swelling? No. Pain Score  0 *  Have you tolerated food without any problems? Yes.    Have you been able to return to your normal activities? Yes.    Do you have any questions about your discharge instructions: Diet   No. Medications  No. Follow up visit  No.  Do you have questions or concerns about your Care? No.  Actions: * If pain score is 4 or above: No action needed, pain <4.

## 2015-02-02 ENCOUNTER — Other Ambulatory Visit: Payer: Self-pay | Admitting: Obstetrics & Gynecology

## 2015-02-02 ENCOUNTER — Other Ambulatory Visit: Payer: Self-pay

## 2015-02-02 DIAGNOSIS — Z1231 Encounter for screening mammogram for malignant neoplasm of breast: Secondary | ICD-10-CM

## 2015-02-28 ENCOUNTER — Ambulatory Visit
Admission: RE | Admit: 2015-02-28 | Discharge: 2015-02-28 | Disposition: A | Discharge status: Home / Self Care | Payer: BLUE CROSS/BLUE SHIELD | Source: Ambulatory Visit

## 2015-02-28 DIAGNOSIS — Z1231 Encounter for screening mammogram for malignant neoplasm of breast: Secondary | ICD-10-CM

## 2015-03-22 ENCOUNTER — Telehealth: Payer: Self-pay | Admitting: Family

## 2015-03-23 NOTE — Telephone Encounter (Signed)
TC out to pt right at 5 pm last night about calling pharmacy & leaving a discount card for Crestor at the front desk. Called pt this morning, after looking last pm and this morning we are currently out of the discount cards. Also talked w/ [facility], they will rerun her prescription as generic to see if the price is any better than the $145 she was told last pm.

## 2015-07-07 ENCOUNTER — Telehealth: Payer: Self-pay | Admitting: Family

## 2015-07-07 NOTE — Telephone Encounter (Signed)
Goes to [facility] °

## 2015-08-05 ENCOUNTER — Other Ambulatory Visit: Payer: Self-pay | Admitting: Family

## 2015-08-05 ENCOUNTER — Telehealth: Payer: Self-pay | Admitting: Family

## 2015-08-05 NOTE — Telephone Encounter (Signed)
Not seen in over 1 year 

## 2015-08-05 NOTE — Telephone Encounter (Signed)
PT needs appt

## 2015-08-05 NOTE — Telephone Encounter (Signed)
Per [facility] pharmacy insurance will not cover Pradaxa but will cover eliquis, warfarin or xarelto

## 2015-08-06 NOTE — Telephone Encounter (Signed)
Patient aware that she needs to be seen and she also states that her cardiologist is the one who prescribes this medication. [facility] notified that they will need to contact Dr. Rennis GoldenHilty about her medication.

## 2015-08-06 NOTE — Telephone Encounter (Signed)
Pt needs to be seen  for follow up and lab work

## 2015-08-10 ENCOUNTER — Telehealth: Payer: Self-pay | Admitting: Internal Medicine

## 2015-08-10 MED ORDER — DABIGATRAN ETEXILATE MESYLATE 150 MG PO CAPS: ORAL_CAPSULE | ORAL | Status: DC

## 2015-08-10 NOTE — Telephone Encounter (Signed)
Patient calling the office for samples of medication:   1.  What medication and dosage are you requesting samples for?Pradaxa  2.  Are you currently out of this medication? Yes

## 2015-08-10 NOTE — Telephone Encounter (Signed)
SPOKE TO PATIENT INFORMED PATIENT 2 BOX SAMPLES ARE AVAILABLE TO FOR PICK UP SHE STARTS SHE WILL PICK IT UP TODAY SHE STATES HER INSURANCE DOES NOT COVER- PRADAXA SHE HAD SAVING CARD  REVIEWING NOTE FROM PRIMARY  ELIQUIS ,XARELTO OR WARFARIN ARE COVERED PATIENT STATES SHE DOES NOT WANT CHANGE ANYTHING  UNTIL SHE SEES DR HILTY  AT NEXT APPOINTMENT ON 5/30  SHE IS AWARE SHE CAN CALL BACK TO SEE IF MORE SAMPLES ARE AVAILABLE

## 2015-08-11 ENCOUNTER — Other Ambulatory Visit: Payer: Self-pay | Admitting: Family

## 2015-08-12 ENCOUNTER — Other Ambulatory Visit: Payer: Self-pay

## 2015-08-12 DIAGNOSIS — I10 Essential (primary) hypertension: Secondary | ICD-10-CM

## 2015-08-12 MED ORDER — TELMISARTAN 40 MG PO TABS: ORAL_TABLET | ORAL | Status: DC

## 2015-08-15 ENCOUNTER — Other Ambulatory Visit: Payer: Self-pay | Admitting: Family

## 2015-08-15 NOTE — Telephone Encounter (Signed)
Not seen since 06/2014

## 2015-09-02 ENCOUNTER — Encounter: Payer: Self-pay | Admitting: Internal Medicine

## 2015-09-02 ENCOUNTER — Ambulatory Visit (INDEPENDENT_AMBULATORY_CARE_PROVIDER_SITE_OTHER): Payer: BLUE CROSS/BLUE SHIELD | Admitting: Internal Medicine

## 2015-09-02 VITALS — BP 126/82 | HR 73 | Ht 66.0 in | Wt 179.2 lb

## 2015-09-02 DIAGNOSIS — Z8673 Personal history of transient ischemic attack (TIA), and cerebral infarction without residual deficits: Secondary | ICD-10-CM

## 2015-09-02 DIAGNOSIS — I10 Essential (primary) hypertension: Secondary | ICD-10-CM | POA: Diagnosis not present

## 2015-09-02 DIAGNOSIS — E785 Hyperlipidemia, unspecified: Secondary | ICD-10-CM

## 2015-09-02 DIAGNOSIS — I480 Paroxysmal atrial fibrillation: Secondary | ICD-10-CM

## 2015-09-02 MED ORDER — RIVAROXABAN 20 MG PO TABS
20.0000 mg | ORAL_TABLET | Freq: Every day | ORAL | Status: DC
Start: 2015-09-02 — End: 2016-12-27

## 2015-09-02 NOTE — Patient Instructions (Addendum)
Medication Instructions:  STOP Pradaxa and START Xarelto  Labwork: CMP and Fasting Lipids  Testing/Procedures: NONE  Follow-Up: Your physician wants you to follow-up in: 1 Year. You will receive a reminder letter in the mail two months in advance. If you don't receive a letter, please call our office to schedule the follow-up appointment.   Any Other Special Instructions Will Be Listed Below (If Applicable).   If you need a refill on your cardiac medications before your next appointment, please call your pharmacy.

## 2015-09-02 NOTE — Progress Notes (Signed)
OFFICE NOTE  Chief Complaint:  No complaints  Primary Care Physician: Jannifer Rodneyhristy Hawks, FNP  HPI:  Kathleen Rodgers is a pleasant 55 year old female who unfortunately recently suffered a stroke when she was visiting [place] in [place]. She developed acute onset left-sided weakness and trouble speaking and presented to emergently to a local hospital. She was then rapidly transferred to a larger hospital, apparently by which time her symptoms had improved. She did not receive TPA. She underwent a thorough workup including carotid Dopplers, head CT and MRI, echocardiogram and TEE. She was also found to have a short period of atrial fibrillation on telemetry and her TEE was negative for thrombus. 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 Her venous Dopplers were performed indicating no evidence of DVT.  She was placed on projects along with other rate controlling medications and has done fairly well.  She does have a compelling family history for heart disease as her mother had her first urge activities 7353 and her grandfather had a stroke. She is currently asymptomatic and has regained basically full recovery.  I had the pleasure of seeing Kathleen Rodgers back in the office today. Since I last saw her she stopped her metoprolol due to complaints of fatigue and reports that it is improved slightly. She's had no further atrial fibrillation. She does get some shortness of breath with exertion but also has had weight gain. She reports some depression and lack of interest in exercise. She is not particularly active. She is having no problems on Pradaxa with regards to bleeding. She recently stopped her Crestor for unknown reasons and a lipid profile showed a marked elevation in cholesterol with total cholesterol 238 and LDL of 158. I agree with restarting her Crestor and she will likely require a higher dose aced on a goal LDL less than 70 and aggressive therapy given her history  of stroke.  09/02/2015  [name] returns today for follow-up. This is an annual visit. She reports over the past year she's done very well. She's not aware of any recurrent atrial fibrillation. She is tolerating Pradaxa however due to changes in her insurance, they are suggesting she switched to Xarelto. She is on Crestor and due for repeat lipid profile.  PMHx:  Past Medical History  Diagnosis Date  . Hypertension   . Hyperlipidemia   . Depression     no meds  . Stroke [facility](HCC)     march 2015    Past Surgical History  Procedure Laterality Date  . Tubal ligation    . Svd      x 3  . Incontinence surgery      FAMHx:  Family History  Problem Relation Age of Onset  . Heart disease Mother   . Heart attack Mother 3953  . Throat cancer Father   . Breast cancer Sister 3140  . Breast cancer Other     neice    SOCHx:   reports that she quit smoking about 20 years ago. Her smoking use included Cigarettes. She has a 31.5 pack-year smoking history. She has never used smokeless tobacco. She reports that she does not drink alcohol or use illicit drugs.  ALLERGIES:  No Known Allergies  ROS: Pertinent items noted in HPI and remainder of comprehensive ROS otherwise negative.  HOME MEDS: Current Outpatient Prescriptions  Medication Sig Dispense Refill  . rosuvastatin (CRESTOR) 10 MG tablet Take 1 tablet (10 mg total) by mouth every  evening. 90 tablet 3  . telmisartan (MICARDIS) 40 MG tablet TAKE 1 TABLET (40 MG TOTAL) BY MOUTH DAILY. 90 tablet 0  . telmisartan (MICARDIS) 40 MG tablet TAKE 1 TABLET (40 MG TOTAL) BY MOUTH DAILY. 30 tablet 0  . rivaroxaban (XARELTO) 20 MG TABS tablet Take 1 tablet (20 mg total) by mouth daily with supper. 30 tablet 11   No current facility-administered medications for this visit.    LABS/IMAGING: No results found for this or any previous visit (from the past 48 hour(s)). No results found.  VITALS: BP 126/82 mmHg  Pulse 73  Ht  (1.676 m)  Wt  179 lb 3.2 oz (81.285 kg)  BMI 28.94 kg/m2  LMP 02/10/2013  EXAM: General appearance: alert and no distress Neck: no carotid bruit and no JVD Lungs: clear to auscultation bilaterally Heart: regular rate and rhythm, S1, S2 normal, no murmur, click, rub or gallop Abdomen: soft, non-tender; bowel sounds normal; no masses,  no organomegaly Extremities: extremities normal, atraumatic, no cyanosis or edema Pulses: 2+ and symmetric Skin: Skin color, texture, turgor normal. No rashes or lesions Neurologic: Grossly normal Psych: Mood, affect normal  EKG: Normal sinus rhythm at 73  ASSESSMENT: 1. Cardioembolic stroke 2. Paroxysmal atrial fibrillation 3. CHADSVASC score of 5 4. Hypertension-controlled 5. Dyslipidemia- on crestor 6. Depression  PLAN: 1.   Kathleen Rodgers is not aware recurrent atrial fibrillation. She would like to switch to Xarelto due to cost issues with her insurance. I've advised her to start 20 mg daily today. Her last dose of Pradaxa was yesterday evening. Will provide her with samples of a co-pay assistance card. Blood pressure is well-controlled. She is on Crestor and is due for repeat lipid profile and will check a metabolic profile make sure renal function is stable. Follow-up with me annually or sooner as necessary.   Chrystie Nose, MD, [facility] Attending Cardiologist [facility]  Chrystie Nose 09/02/2015, 9:32 AM

## 2015-09-13 ENCOUNTER — Ambulatory Visit: Payer: BLUE CROSS/BLUE SHIELD | Admitting: Internal Medicine

## 2015-09-20 ENCOUNTER — Other Ambulatory Visit: Payer: Self-pay | Admitting: Family

## 2015-11-02 ENCOUNTER — Telehealth: Payer: Self-pay | Admitting: Internal Medicine

## 2015-11-02 NOTE — Telephone Encounter (Signed)
Returned call to pt-pt requesting xarelto discount card.  Placed in mail to be mailed out to pt per pt request.  Pt verbalized understanding.

## 2015-11-02 NOTE — Telephone Encounter (Signed)
Pt wants to know if you have a discount card for Xarelto?

## 2015-11-07 ENCOUNTER — Telehealth: Payer: Self-pay | Admitting: Internal Medicine

## 2015-11-07 NOTE — Telephone Encounter (Signed)
New message       Calling to let nurse know pt did not get saving card for xarelto.  Someone was going to mail it last thurs.  She is out of xarelto.  Pt request a call back

## 2015-11-07 NOTE — Telephone Encounter (Addendum)
Returned pt call-pt made aware mail usually takes 7-10 business days but the savings card has been mailed to her.  Also offered samples in order to get her through the week until she gets the card since she is out.  Pt verbalized understanding and states she will come out to get the samples at the front desk.    Samples: Xarelto 20mg   LOT: [id number] EXP: 11/19

## 2015-11-10 ENCOUNTER — Other Ambulatory Visit: Payer: Self-pay | Admitting: Family

## 2015-11-10 DIAGNOSIS — I10 Essential (primary) hypertension: Secondary | ICD-10-CM

## 2015-11-10 NOTE — Telephone Encounter (Signed)
Last seen 07/14/2014. Please advise

## 2016-01-30 ENCOUNTER — Other Ambulatory Visit: Payer: Self-pay | Admitting: Obstetrics & Gynecology

## 2016-01-30 DIAGNOSIS — Z1231 Encounter for screening mammogram for malignant neoplasm of breast: Secondary | ICD-10-CM

## 2016-02-14 ENCOUNTER — Encounter: Payer: Self-pay | Admitting: Family Medicine

## 2016-02-14 ENCOUNTER — Ambulatory Visit (INDEPENDENT_AMBULATORY_CARE_PROVIDER_SITE_OTHER): Payer: BLUE CROSS/BLUE SHIELD | Admitting: Family Medicine

## 2016-02-14 VITALS — BP 140/87 | HR 71 | Temp 96.8°F | Ht 66.0 in | Wt 181.2 lb

## 2016-02-14 DIAGNOSIS — I480 Paroxysmal atrial fibrillation: Secondary | ICD-10-CM

## 2016-02-14 DIAGNOSIS — R21 Rash and other nonspecific skin eruption: Secondary | ICD-10-CM

## 2016-02-14 DIAGNOSIS — E785 Hyperlipidemia, unspecified: Secondary | ICD-10-CM | POA: Diagnosis not present

## 2016-02-14 DIAGNOSIS — I10 Essential (primary) hypertension: Secondary | ICD-10-CM | POA: Diagnosis not present

## 2016-02-14 MED ORDER — TRIAMCINOLONE ACETONIDE 0.1 % EX CREA: 1.0000 "application " | TOPICAL_CREAM | Freq: Two times a day (BID) | CUTANEOUS | 0 refills | Status: DC

## 2016-02-14 NOTE — Addendum Note (Signed)
Addended by: Elenora GammaBRADSHAW, Rodger Giangregorio L on: 02/14/2016 12:37 PM   Modules accepted: Orders

## 2016-02-14 NOTE — Progress Notes (Addendum)
   HPI  Patient presents today here to follow-up for hyperlipidemia, hypertension, and atrial fibrillation.  Patient has a history of paroxysmal atrial fibrillation with patent foramen ovale causing a stroke a few years ago. She's been anticoagulated on Xarelto since that time. She does not have any palpitations or racing heartbeats She denies any bleeding.  Hyperlipidemia She's taking Crestor on 5-6 days per week. She denies any myalgias or concerns regarding the medication.  Hypertension. Good medication compliance No chest pain, palpitations, leg edema, or dyspnea.  She has had a "cold" for the last 4 days or so, she is improving on Mucinex DM 12 hour  Describes itchy rash present for 1-1/2 weeks, she's been using antibiotic cream over-the-counter with no improvement.  PMH: Smoking status noted ROS: Per HPI  Objective: BP 140/87   Pulse 71   Temp (!) 96.8 F (36 C) (Oral)   Ht _0  (1.676 m)   Wt 181 lb 3.2 oz (82.2 kg)   LMP 02/10/2013   BMI 29.25 kg/m  Gen: NAD, alert, cooperative with exam HEENT: NCAT CV: RRR, good S1/S2, no murmur Ext: No edema, warm Neuro: Alert and oriented, No gross deficits  Skin:  Area on right hip of about 6 cm x 6 cm with small papules and excoriations No scaling, fluctuance, or tenderness to palpation.  Assessment and plan:  # Paroxysmal A. fib Regular rate and rhythm today, stable No signs of bleeding, and coagulated given history of involved stroke with PFO. Continue Xarelto, CBC  # Hypertension Borderline today, however normal at home Continue telmisartan  # HLD Likely improved Goal with LDL less than 70 given history of stroke, despite the fact that it was embolic. Taking Crestor about 5-6 days per week, could increase to 20 mg if not at goal, no myalgias.  # Healthcare maintenance Discussed flu vaccine, she declines  Rash Consistent with resolving contact dermatitis or xerosis. Treat with triamcinolone cream Return  to clinic as needed    Orders Placed This Encounter  Procedures  . CBC with Differential/Platelet  . CMP14+EGFR  . Lipid panel    Laroy Apple, MD [facility] 02/14/2016, 12:36 PM

## 2016-02-14 NOTE — Patient Instructions (Signed)
Great to meet you!  Keep taking the mucinex, please come back to see us if anything gets worse or you do not get better as expected.

## 2016-02-15 LAB — CMP14+EGFR
ALK PHOS: 101 IU/L (ref 39–117)
ALT: 25 IU/L (ref 0–32)
AST: 20 IU/L (ref 0–40)
Albumin/Globulin Ratio: 1.4 (ref 1.2–2.2)
Albumin: 4.4 g/dL (ref 3.5–5.5)
BUN/Creatinine Ratio: 15 (ref 9–23)
BUN: 13 mg/dL (ref 6–24)
Bilirubin Total: 0.6 mg/dL (ref 0.0–1.2)
CO2: 25 mmol/L (ref 18–29)
CREATININE: 0.84 mg/dL (ref 0.57–1.00)
Calcium: 9.6 mg/dL (ref 8.7–10.2)
Chloride: 102 mmol/L (ref 96–106)
GFR calc Af Amer: 89 mL/min/{1.73_m2} (ref >59)
GFR calc non Af Amer: 77 mL/min/{1.73_m2} (ref >59)
GLUCOSE: 101 mg/dL — AB (ref 65–99)
Globulin, Total: 3.1 g/dL (ref 1.5–4.5)
Potassium: 4.6 mmol/L (ref 3.5–5.2)
SODIUM: 141 mmol/L (ref 134–144)
Total Protein: 7.5 g/dL (ref 6.0–8.5)

## 2016-02-15 LAB — CBC WITH DIFFERENTIAL/PLATELET
BASOS ABS: 0 10*3/uL (ref 0.0–0.2)
Basos: 0 %
EOS (ABSOLUTE): 0.2 10*3/uL (ref 0.0–0.4)
Eos: 3 %
Hematocrit: 41.4 % (ref 34.0–46.6)
Hemoglobin: 14 g/dL (ref 11.1–15.9)
Immature Grans (Abs): 0 10*3/uL (ref 0.0–0.1)
Immature Granulocytes: 0 %
LYMPHS ABS: 1.8 10*3/uL (ref 0.7–3.1)
Lymphs: 37 %
MCH: 30.6 pg (ref 26.6–33.0)
MCHC: 33.8 g/dL (ref 31.5–35.7)
MCV: 90 fL (ref 79–97)
MONOCYTES: 8 %
MONOS ABS: 0.4 10*3/uL (ref 0.1–0.9)
Neutrophils Absolute: 2.6 10*3/uL (ref 1.4–7.0)
Neutrophils: 52 %
PLATELETS: 224 10*3/uL (ref 150–379)
RBC: 4.58 x10E6/uL (ref 3.77–5.28)
RDW: 13.4 % (ref 12.3–15.4)
WBC: 4.9 10*3/uL (ref 3.4–10.8)

## 2016-02-15 LAB — LIPID PANEL
CHOLESTEROL TOTAL: 155 mg/dL (ref 100–199)
Chol/HDL Ratio: 3.8 ratio units (ref 0.0–4.4)
HDL: 41 mg/dL (ref >39)
LDL CALC: 88 mg/dL (ref 0–99)
TRIGLYCERIDES: 132 mg/dL (ref 0–149)
VLDL CHOLESTEROL CAL: 26 mg/dL (ref 5–40)

## 2016-02-20 ENCOUNTER — Telehealth: Payer: Self-pay | Admitting: Family

## 2016-02-20 MED ORDER — AZITHROMYCIN 250 MG PO TABS: ORAL_TABLET | ORAL | 0 refills | Status: DC

## 2016-02-20 NOTE — Telephone Encounter (Signed)
Continued congestion OTCs not helping Pt wants antibiotic Please advise

## 2016-02-20 NOTE — Telephone Encounter (Signed)
azithro sent.   Seen for several other problems about 1 week ago.   Murtis SinkSam Alonda Weaber, MD [facility] 02/20/2016, 12:52 PM

## 2016-02-20 NOTE — Telephone Encounter (Signed)
Patient aware.

## 2016-02-29 ENCOUNTER — Ambulatory Visit
Admission: RE | Admit: 2016-02-29 | Discharge: 2016-02-29 | Disposition: A | Discharge status: Home / Self Care | Payer: BLUE CROSS/BLUE SHIELD | Source: Ambulatory Visit | Attending: Obstetrics & Gynecology | Admitting: Obstetrics & Gynecology

## 2016-02-29 DIAGNOSIS — Z1231 Encounter for screening mammogram for malignant neoplasm of breast: Secondary | ICD-10-CM | POA: Diagnosis not present

## 2016-03-05 ENCOUNTER — Other Ambulatory Visit: Payer: Self-pay | Admitting: Obstetrics & Gynecology

## 2016-03-05 DIAGNOSIS — R928 Other abnormal and inconclusive findings on diagnostic imaging of breast: Secondary | ICD-10-CM

## 2016-03-12 ENCOUNTER — Ambulatory Visit
Admission: RE | Admit: 2016-03-12 | Discharge: 2016-03-12 | Disposition: A | Discharge status: Home / Self Care | Payer: BLUE CROSS/BLUE SHIELD | Source: Ambulatory Visit | Attending: Obstetrics & Gynecology | Admitting: Obstetrics & Gynecology

## 2016-03-12 DIAGNOSIS — R922 Inconclusive mammogram: Secondary | ICD-10-CM | POA: Diagnosis not present

## 2016-03-12 DIAGNOSIS — R928 Other abnormal and inconclusive findings on diagnostic imaging of breast: Secondary | ICD-10-CM

## 2016-03-12 DIAGNOSIS — N6489 Other specified disorders of breast: Secondary | ICD-10-CM | POA: Diagnosis not present

## 2016-03-21 ENCOUNTER — Encounter: Payer: Self-pay | Admitting: Family Medicine

## 2016-03-21 ENCOUNTER — Ambulatory Visit (INDEPENDENT_AMBULATORY_CARE_PROVIDER_SITE_OTHER): Payer: BLUE CROSS/BLUE SHIELD | Admitting: Family Medicine

## 2016-03-21 VITALS — BP 127/86 | HR 91 | Temp 97.1°F | Ht 66.0 in | Wt 182.8 lb

## 2016-03-21 DIAGNOSIS — R05 Cough: Secondary | ICD-10-CM

## 2016-03-21 DIAGNOSIS — R059 Cough, unspecified: Secondary | ICD-10-CM

## 2016-03-21 MED ORDER — METHYLPREDNISOLONE ACETATE 80 MG/ML IJ SUSP
80.0000 mg | Freq: Once | INTRAMUSCULAR | Status: AC
  Administered 2016-03-21: 80 mg via INTRAMUSCULAR

## 2016-03-21 NOTE — Progress Notes (Signed)
   HPI  Patient presents today here with cough.  Patient explains that after taking the azithromycin her cough completely away. However 4 days ago she had slow recurrence of his cough, she states that it feels like there's some phlegm deep in her chest that will not come up.  She has mild dyspnea. She also has mild exercise intolerance compared to usual. She's eating and drinking normally, she denies any fever, chills, sweats, or chest pain.  Shows a history of atrial fibrillation treated with Xarelto.  PMH: Smoking status noted ROS: Per HPI  Objective: BP 127/86   Pulse 91   Temp 97.1 F (36.2 C) (Oral)   Ht 5\' 6"  (1.676 m)   Wt 182 lb 12.8 oz (82.9 kg)   LMP 02/10/2013   BMI 29.50 kg/m  Gen: NAD, alert, cooperative with exam HEENT: NCAT, oropharynx clear, TMs normal bilaterally, nares clear CV: RRR, good S1/S2, no murmur Resp: CTABL, no wheezes, non-labored Ext: No edema, warm Neuro: Alert and oriented, No gross deficits  Assessment and plan:  # Cough Most likely post infectious, inflammatory reaction in the lung Treated with IM Depo-Medrol A skin has been treated with azithromycin and feels much better, however has a lingering cough. The left the door open for her if she begins to feel worse I would be glad to send her another course of antibiotics, also I would be glad to do a chest x-ray to help evaluate her for true lobar pneumonia.  Considering that this is the "second sickness" I am open to treating more aggressively, however this time she has no systemic symptoms and feels very well except for the cough.    Murtis SinkSam Aeris Hersman, MD [facility] 03/21/2016, 11:32 AM

## 2016-03-21 NOTE — Addendum Note (Signed)
Addended by: Lorelee CoverOSTOSKY, Broghan Pannone C on: 03/21/2016 11:35 AM   Modules accepted: Orders

## 2016-03-21 NOTE — Patient Instructions (Signed)
Great to see you!  Please call or come back if you are getting worse or not improving as expected.

## 2016-10-17 ENCOUNTER — Other Ambulatory Visit: Payer: Self-pay | Admitting: Family

## 2016-10-22 ENCOUNTER — Other Ambulatory Visit: Payer: Self-pay | Admitting: Obstetrics & Gynecology

## 2016-10-22 DIAGNOSIS — N6489 Other specified disorders of breast: Secondary | ICD-10-CM

## 2016-10-25 ENCOUNTER — Other Ambulatory Visit: Payer: Self-pay | Admitting: Family

## 2016-10-25 DIAGNOSIS — I10 Essential (primary) hypertension: Secondary | ICD-10-CM

## 2016-10-26 NOTE — Telephone Encounter (Signed)
Last seen 03/21/16  Dr Kathlene NovemberBradshaw   Christy PCP

## 2016-11-02 ENCOUNTER — Ambulatory Visit
Admission: RE | Admit: 2016-11-02 | Discharge: 2016-11-02 | Disposition: A | Discharge status: Home / Self Care | Payer: BLUE CROSS/BLUE SHIELD | Source: Ambulatory Visit | Attending: Obstetrics & Gynecology | Admitting: Obstetrics & Gynecology

## 2016-11-02 DIAGNOSIS — R922 Inconclusive mammogram: Secondary | ICD-10-CM | POA: Diagnosis not present

## 2016-11-02 DIAGNOSIS — N6489 Other specified disorders of breast: Secondary | ICD-10-CM

## 2016-12-27 ENCOUNTER — Other Ambulatory Visit: Payer: Self-pay | Admitting: Internal Medicine

## 2017-01-13 ENCOUNTER — Other Ambulatory Visit: Payer: Self-pay | Admitting: Family

## 2017-01-20 ENCOUNTER — Other Ambulatory Visit: Payer: Self-pay | Admitting: Internal Medicine

## 2017-01-23 ENCOUNTER — Other Ambulatory Visit: Payer: Self-pay | Admitting: Family

## 2017-01-23 NOTE — Telephone Encounter (Signed)
Last seen 03/21/16  Dr Ermalinda Memos

## 2017-01-30 DIAGNOSIS — Z6830 Body mass index (BMI) 30.0-30.9, adult: Secondary | ICD-10-CM | POA: Diagnosis not present

## 2017-01-30 DIAGNOSIS — Z124 Encounter for screening for malignant neoplasm of cervix: Secondary | ICD-10-CM | POA: Diagnosis not present

## 2017-01-30 DIAGNOSIS — Z01419 Encounter for gynecological examination (general) (routine) without abnormal findings: Secondary | ICD-10-CM | POA: Diagnosis not present

## 2017-02-10 ENCOUNTER — Other Ambulatory Visit: Payer: Self-pay | Admitting: Family

## 2017-02-11 NOTE — Telephone Encounter (Signed)
Last seen 03/21/16  Dr Ermalinda MemosBradshaw

## 2017-02-14 ENCOUNTER — Encounter: Payer: Self-pay | Admitting: Internal Medicine

## 2017-02-14 ENCOUNTER — Ambulatory Visit (INDEPENDENT_AMBULATORY_CARE_PROVIDER_SITE_OTHER): Payer: BLUE CROSS/BLUE SHIELD | Admitting: Internal Medicine

## 2017-02-14 VITALS — BP 112/84 | HR 81 | Ht 66.0 in | Wt 184.2 lb

## 2017-02-14 DIAGNOSIS — I4891 Unspecified atrial fibrillation: Secondary | ICD-10-CM | POA: Diagnosis not present

## 2017-02-14 DIAGNOSIS — Z79899 Other long term (current) drug therapy: Secondary | ICD-10-CM | POA: Diagnosis not present

## 2017-02-14 DIAGNOSIS — E785 Hyperlipidemia, unspecified: Secondary | ICD-10-CM

## 2017-02-14 DIAGNOSIS — Z23 Encounter for immunization: Secondary | ICD-10-CM

## 2017-02-14 DIAGNOSIS — Z7901 Long term (current) use of anticoagulants: Secondary | ICD-10-CM | POA: Insufficient documentation

## 2017-02-14 MED ORDER — TELMISARTAN 40 MG PO TABS: ORAL_TABLET | ORAL | 3 refills | Status: DC

## 2017-02-14 MED ORDER — ROSUVASTATIN CALCIUM 10 MG PO TABS: ORAL_TABLET | ORAL | 3 refills | Status: DC

## 2017-02-14 MED ORDER — RIVAROXABAN 20 MG PO TABS: ORAL_TABLET | ORAL | 3 refills | Status: DC

## 2017-02-14 NOTE — Progress Notes (Signed)
OFFICE NOTE  Chief Complaint:  Routine follow-up  Primary Care Physician: Elenora Gamma, MD  HPI:  Kathleen Rodgers is a pleasant 56 year old female who unfortunately recently suffered a stroke when she was visiting her son who was in [place]. She developed acute onset left-sided weakness and trouble speaking and presented to emergently to a local hospital. She was then rapidly transferred to a larger hospital, apparently by which time her symptoms had improved. She did not receive TPA. She underwent a thorough workup including carotid Dopplers, head CT and MRI, echocardiogram and TEE. She was also found to have a short period of atrial fibrillation on telemetry and her TEE was negative for thrombus. 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 Her venous Dopplers were performed indicating no evidence of DVT.  She was placed on projects along with other rate controlling medications and has done fairly well.  She does have a compelling family history for heart disease as her mother had her first urge activities 23 and her grandfather had a stroke. She is currently asymptomatic and has regained basically full recovery.  I had the pleasure of seeing Kathleen Rodgers back in the office today. Since I last saw her she stopped her metoprolol due to complaints of fatigue and reports that it is improved slightly. She's had no further atrial fibrillation. She does get some shortness of breath with exertion but also has had weight gain. She reports some depression and lack of interest in exercise. She is not particularly active. She is having no problems on Pradaxa with regards to bleeding. She recently stopped her Crestor for unknown reasons and a lipid profile showed a marked elevation in cholesterol with total cholesterol 238 and LDL of 158. I agree with restarting her Crestor and she will likely require a higher dose aced on a goal LDL less than 70 and aggressive therapy given her  history of stroke.  09/02/2015  Kathleen Rodgers returns today for follow-up. This is an annual visit. She reports over the past year she's done very well. She's not aware of any recurrent atrial fibrillation. She is tolerating Pradaxa however due to changes in her insurance, they are suggesting she switched to Xarelto. She is on Crestor and due for repeat lipid profile.  02/14/2017  Kathleen Rodgers returns today for routine follow-up.  She denies any recurrent atrial fibrillation that she is aware of.  She has had no further stroke or TIA events.  Her blood pressure is well controlled.  She has dyslipidemia on Crestor but has not had a repeat lipid profile in a year.  She suffered from depression in the past but seems to be doing well.  PMHx:  Past Medical History:  Diagnosis Date  . Depression    no meds  . Hyperlipidemia   . Hypertension   . Stroke [facility])    march 2015    Past Surgical History:  Procedure Laterality Date  . INCONTINENCE SURGERY    . SVD     x 3  . TUBAL LIGATION      FAMHx:  Family History  Problem Relation Age of Onset  . Heart disease Mother   . Heart attack Mother 70  . Throat cancer Father   . Breast cancer Sister 75  . Breast cancer Other        neice    SOCHx:   reports that she quit smoking about 21 years ago. Her smoking use included Cigarettes. She  has a 31.50 pack-year smoking history. She has never used smokeless tobacco. She reports that she does not drink alcohol or use drugs.  ALLERGIES:  No Known Allergies  ROS: Pertinent items noted in HPI and remainder of comprehensive ROS otherwise negative.  HOME MEDS: Current Outpatient Prescriptions  Medication Sig Dispense Refill  . rivaroxaban (XARELTO) 20 MG TABS tablet TAKE 1 TABLET DAILY WITH SUPPER 90 tablet 3  . rosuvastatin (CRESTOR) 10 MG tablet TAKE 1 TABLET BY MOUTH EVERY DAY IN THE EVENING 90 tablet 3  . telmisartan (MICARDIS) 40 MG tablet TAKE 1 TABLET (40 MG TOTAL) BY MOUTH DAILY. 90  tablet 3  . triamcinolone cream (KENALOG) 0.1 % Apply 1 application topically 2 (two) times daily. 30 g 0   No current facility-administered medications for this visit.     LABS/IMAGING: No results found for this or any previous visit (from the past 48 hour(s)). No results found.  VITALS: BP 112/84   Pulse 81   Ht 5\' 6"  (1.676 m)   Wt 184 lb 3.2 oz (83.6 kg)   LMP 02/10/2013   SpO2 98%   BMI 29.73 kg/m   EXAM: General appearance: alert and no distress Neck: no carotid bruit and no JVD Lungs: clear to auscultation bilaterally Heart: regular rate and rhythm, S1, S2 normal, no murmur, click, rub or gallop Abdomen: soft, non-tender; bowel sounds normal; no masses,  no organomegaly Extremities: extremities normal, atraumatic, no cyanosis or edema Pulses: 2+ and symmetric Skin: Skin color, texture, turgor normal. No rashes or lesions Neurologic: Grossly normal Psych: Mood, affect normal  EKG: Normal sinus rhythm at 81 personally reviewed  ASSESSMENT: 1. History of cardioembolic stroke 2. Paroxysmal atrial fibrillation 3. CHADSVASC score of 5 4. Hypertension-controlled 5. Dyslipidemia- on crestor 6. Depression  PLAN: 1.   Kathleen Rodgers seems to be doing well and is tolerating anticoagulation with Xarelto.  She has not had any lab work in the past year therefore we will check a CBC, CMET, TSH and lipid profile.  She should continue on rosuvastatin and I will titrate as necessary.  Blood pressures well controlled at 112/84.  She is interested in a flu vaccine today and we will provide it.  Follow-up with me annually or sooner as necessary.  Chrystie NoseKenneth C. Aksh Swart, MD, [facility] Attending Cardiologist [facility]  Chrystie NoseKenneth C Kess Mcilwain 02/14/2017, 6:17 PM

## 2017-02-14 NOTE — Patient Instructions (Signed)
Your physician recommends that you return for lab work FASTING  Your physician wants you to follow-up in: ONE YEAR with Dr. Hilty. You will receive a reminder letter in the mail two months in advance. If you don't receive a letter, please call our office to schedule the follow-up appointment.   

## 2017-02-18 DIAGNOSIS — I4891 Unspecified atrial fibrillation: Secondary | ICD-10-CM | POA: Diagnosis not present

## 2017-02-18 DIAGNOSIS — Z7901 Long term (current) use of anticoagulants: Secondary | ICD-10-CM | POA: Diagnosis not present

## 2017-02-18 DIAGNOSIS — Z79899 Other long term (current) drug therapy: Secondary | ICD-10-CM | POA: Diagnosis not present

## 2017-02-18 DIAGNOSIS — E785 Hyperlipidemia, unspecified: Secondary | ICD-10-CM | POA: Diagnosis not present

## 2017-02-19 LAB — COMPREHENSIVE METABOLIC PANEL
ALT: 22 IU/L (ref 0–32)
AST: 21 IU/L (ref 0–40)
Albumin/Globulin Ratio: 1.5 (ref 1.2–2.2)
Albumin: 4.3 g/dL (ref 3.5–5.5)
Alkaline Phosphatase: 98 IU/L (ref 39–117)
BUN/Creatinine Ratio: 13 (ref 9–23)
BUN: 11 mg/dL (ref 6–24)
Bilirubin Total: 0.5 mg/dL (ref 0.0–1.2)
CALCIUM: 9.4 mg/dL (ref 8.7–10.2)
CO2: 25 mmol/L (ref 20–29)
Chloride: 103 mmol/L (ref 96–106)
Creatinine, Ser: 0.87 mg/dL (ref 0.57–1.00)
GFR, EST AFRICAN AMERICAN: 85 mL/min/{1.73_m2} (ref >59)
GFR, EST NON AFRICAN AMERICAN: 74 mL/min/{1.73_m2} (ref >59)
Globulin, Total: 2.8 g/dL (ref 1.5–4.5)
Glucose: 94 mg/dL (ref 65–99)
Potassium: 4.6 mmol/L (ref 3.5–5.2)
Sodium: 139 mmol/L (ref 134–144)
TOTAL PROTEIN: 7.1 g/dL (ref 6.0–8.5)

## 2017-02-19 LAB — CBC
HEMATOCRIT: 40 % (ref 34.0–46.6)
HEMOGLOBIN: 13.8 g/dL (ref 11.1–15.9)
MCH: 30.9 pg (ref 26.6–33.0)
MCHC: 34.5 g/dL (ref 31.5–35.7)
MCV: 90 fL (ref 79–97)
Platelets: 201 10*3/uL (ref 150–379)
RBC: 4.46 x10E6/uL (ref 3.77–5.28)
RDW: 13.4 % (ref 12.3–15.4)
WBC: 3.3 10*3/uL — AB (ref 3.4–10.8)

## 2017-02-19 LAB — TSH: TSH: 2.04 u[IU]/mL (ref 0.450–4.500)

## 2017-02-19 LAB — LIPID PANEL
CHOLESTEROL TOTAL: 229 mg/dL — AB (ref 100–199)
Chol/HDL Ratio: 6 ratio — ABNORMAL HIGH (ref 0.0–4.4)
HDL: 38 mg/dL — AB (ref >39)
LDL Calculated: 159 mg/dL — ABNORMAL HIGH (ref 0–99)
TRIGLYCERIDES: 158 mg/dL — AB (ref 0–149)
VLDL Cholesterol Cal: 32 mg/dL (ref 5–40)

## 2017-03-29 ENCOUNTER — Other Ambulatory Visit: Payer: Self-pay

## 2017-03-29 DIAGNOSIS — D229 Melanocytic nevi, unspecified: Secondary | ICD-10-CM | POA: Diagnosis not present

## 2017-03-29 DIAGNOSIS — D485 Neoplasm of uncertain behavior of skin: Secondary | ICD-10-CM | POA: Diagnosis not present

## 2017-05-03 DIAGNOSIS — L570 Actinic keratosis: Secondary | ICD-10-CM | POA: Diagnosis not present

## 2017-05-03 DIAGNOSIS — L820 Inflamed seborrheic keratosis: Secondary | ICD-10-CM | POA: Diagnosis not present

## 2017-06-07 ENCOUNTER — Other Ambulatory Visit: Payer: Self-pay | Admitting: Obstetrics & Gynecology

## 2017-06-07 DIAGNOSIS — Z1231 Encounter for screening mammogram for malignant neoplasm of breast: Secondary | ICD-10-CM

## 2017-06-26 ENCOUNTER — Ambulatory Visit
Admission: RE | Admit: 2017-06-26 | Discharge: 2017-06-26 | Disposition: A | Discharge status: Home / Self Care | Payer: BLUE CROSS/BLUE SHIELD | Source: Ambulatory Visit | Attending: Obstetrics & Gynecology | Admitting: Obstetrics & Gynecology

## 2017-06-26 DIAGNOSIS — Z1231 Encounter for screening mammogram for malignant neoplasm of breast: Secondary | ICD-10-CM | POA: Diagnosis not present

## 2017-07-01 ENCOUNTER — Telehealth: Payer: Self-pay | Admitting: Family Medicine

## 2017-07-01 DIAGNOSIS — I10 Essential (primary) hypertension: Secondary | ICD-10-CM

## 2017-07-01 DIAGNOSIS — E785 Hyperlipidemia, unspecified: Secondary | ICD-10-CM

## 2017-07-01 NOTE — Telephone Encounter (Signed)
Labs placed.   Murtis Sink/Sam Fatiha Guzy, MD [facility] 07/01/2017, 12:09 PM

## 2017-07-01 NOTE — Telephone Encounter (Signed)
Pt notified of orders for labs Pt will come in on 07/02/2017 for labwork

## 2017-07-03 ENCOUNTER — Ambulatory Visit (INDEPENDENT_AMBULATORY_CARE_PROVIDER_SITE_OTHER): Payer: BLUE CROSS/BLUE SHIELD | Admitting: Family Medicine

## 2017-07-03 ENCOUNTER — Encounter: Payer: Self-pay | Admitting: Family Medicine

## 2017-07-03 VITALS — BP 141/94 | HR 75 | Temp 96.7°F | Ht 66.0 in | Wt 183.0 lb

## 2017-07-03 DIAGNOSIS — Z23 Encounter for immunization: Secondary | ICD-10-CM

## 2017-07-03 DIAGNOSIS — Z0000 Encounter for general adult medical examination without abnormal findings: Secondary | ICD-10-CM | POA: Diagnosis not present

## 2017-07-03 NOTE — Progress Notes (Signed)
   HPI  Patient presents today here for annual physical exam.  Patient feels well and has no complaints.  She admits to poor medication compliance with Xarelto and telmisartan.  She states that she misses pills frequently. She cannot explain why and states that "she does not know why she does not take them regularly."  She understands the risk with not taking Xarelto, she also understands risk of taking it.  Patient states that she has been watching her diet more carefully.  She is also beginning to exercise on a regular basis with walking.  She had a mammogram last week.  She had a Pap smear in October 2018 with Dr. Deatra Ina.  PMH: Smoking status noted ROS: Per HPI  Objective: BP (!) 141/94   Pulse 75   Temp (!) 96.7 F (35.9 C) (Oral)   Ht '5\' 6"'$  (1.676 m)   Wt 183 lb (83 kg)   LMP 02/10/2013   BMI 29.54 kg/m  Gen: NAD, alert, cooperative with exam HEENT: NCAT, EOMI, PERRL CV: RRR, good S1/S2, no murmur Resp: CTABL, no wheezes, non-labored Abd: SNTND, BS present, no guarding or organomegaly Ext: No edema, warm Neuro: Alert and oriented, No gross deficits  Assessment and plan:  #Annual physical exam Normal exam except for slightly elevated weight. Patient is making very positive lifestyle changes. We discussed medication compliance and simply taking medications before bed which she feels will help her compliance. Hepatitis C screening discussed   Orders Placed This Encounter  Procedures  . Tdap vaccine greater than or equal to 7yo IM  . Hepatitis C antibody  . CBC with Differential/Platelet  . CMP14+EGFR  . Lipid panel     Laroy Apple, MD [facility] 07/03/2017, 2:42 PM

## 2017-07-03 NOTE — Patient Instructions (Signed)
Great to see you!   

## 2017-07-04 LAB — LIPID PANEL
CHOLESTEROL TOTAL: 143 mg/dL (ref 100–199)
Chol/HDL Ratio: 3.1 ratio (ref 0.0–4.4)
HDL: 46 mg/dL (ref >39)
LDL Calculated: 67 mg/dL (ref 0–99)
TRIGLYCERIDES: 151 mg/dL — AB (ref 0–149)
VLDL CHOLESTEROL CAL: 30 mg/dL (ref 5–40)

## 2017-07-04 LAB — HEPATITIS C ANTIBODY: Hep C Virus Ab: <0.1 s/co ratio (ref 0.0–0.9)

## 2017-07-04 LAB — CMP14+EGFR
A/G RATIO: 1.5 (ref 1.2–2.2)
ALBUMIN: 4.3 g/dL (ref 3.5–5.5)
ALK PHOS: 98 IU/L (ref 39–117)
ALT: 20 IU/L (ref 0–32)
AST: 17 IU/L (ref 0–40)
BUN/Creatinine Ratio: 11 (ref 9–23)
BUN: 9 mg/dL (ref 6–24)
Bilirubin Total: 0.4 mg/dL (ref 0.0–1.2)
CALCIUM: 9.5 mg/dL (ref 8.7–10.2)
CHLORIDE: 106 mmol/L (ref 96–106)
CO2: 23 mmol/L (ref 20–29)
Creatinine, Ser: 0.82 mg/dL (ref 0.57–1.00)
GFR calc non Af Amer: 79 mL/min/{1.73_m2} (ref >59)
GFR, EST AFRICAN AMERICAN: 91 mL/min/{1.73_m2} (ref >59)
GLOBULIN, TOTAL: 2.9 g/dL (ref 1.5–4.5)
Glucose: 107 mg/dL — ABNORMAL HIGH (ref 65–99)
Potassium: 3.9 mmol/L (ref 3.5–5.2)
Sodium: 145 mmol/L — ABNORMAL HIGH (ref 134–144)
Total Protein: 7.2 g/dL (ref 6.0–8.5)

## 2017-07-04 LAB — CBC WITH DIFFERENTIAL/PLATELET
Basophils Absolute: 0 10*3/uL (ref 0.0–0.2)
Basos: 0 %
EOS (ABSOLUTE): 0.2 10*3/uL (ref 0.0–0.4)
Eos: 4 %
HEMOGLOBIN: 13.5 g/dL (ref 11.1–15.9)
Hematocrit: 39.3 % (ref 34.0–46.6)
Immature Grans (Abs): 0 10*3/uL (ref 0.0–0.1)
Immature Granulocytes: 0 %
LYMPHS ABS: 1.5 10*3/uL (ref 0.7–3.1)
Lymphs: 33 %
MCH: 30.5 pg (ref 26.6–33.0)
MCHC: 34.4 g/dL (ref 31.5–35.7)
MCV: 89 fL (ref 79–97)
MONOCYTES: 11 %
MONOS ABS: 0.5 10*3/uL (ref 0.1–0.9)
Neutrophils Absolute: 2.3 10*3/uL (ref 1.4–7.0)
Neutrophils: 52 %
Platelets: 203 10*3/uL (ref 150–379)
RBC: 4.42 x10E6/uL (ref 3.77–5.28)
RDW: 13.6 % (ref 12.3–15.4)
WBC: 4.5 10*3/uL (ref 3.4–10.8)

## 2017-07-06 LAB — HGB A1C W/O EAG: HEMOGLOBIN A1C: 5.9 % — AB (ref 4.8–5.6)

## 2017-07-06 LAB — SPECIMEN STATUS REPORT

## 2017-07-08 ENCOUNTER — Telehealth: Payer: Self-pay | Admitting: Family Medicine

## 2017-07-08 NOTE — Telephone Encounter (Signed)
Patient aware.

## 2017-09-12 DIAGNOSIS — L570 Actinic keratosis: Secondary | ICD-10-CM | POA: Diagnosis not present

## 2018-01-03 ENCOUNTER — Encounter: Payer: Self-pay | Admitting: Family Medicine

## 2018-01-03 ENCOUNTER — Ambulatory Visit: Payer: BLUE CROSS/BLUE SHIELD | Admitting: Family Medicine

## 2018-01-03 VITALS — BP 129/80 | HR 79 | Temp 97.9°F | Ht 66.0 in | Wt 182.0 lb

## 2018-01-03 DIAGNOSIS — K219 Gastro-esophageal reflux disease without esophagitis: Secondary | ICD-10-CM | POA: Insufficient documentation

## 2018-01-03 MED ORDER — FAMOTIDINE 40 MG PO TABS: 20.0000 mg | ORAL_TABLET | Freq: Every day | ORAL | 3 refills | Status: DC

## 2018-01-03 MED ORDER — FAMOTIDINE 40 MG PO TABS: 40.0000 mg | ORAL_TABLET | Freq: Every day | ORAL | 3 refills | Status: DC

## 2018-01-03 NOTE — Patient Instructions (Signed)

## 2018-01-03 NOTE — Progress Notes (Signed)
Subjective:    Patient ID: Kathleen Rodgers, female    DOB: 1960-07-11, 57 y.o.   MRN: [id number]  Chief Complaint:  Mouth feels dry, feels like something in throat (reports when she eats anything spicy she feels like food is stuck in throat, almost can smell food)   HPI: Kathleen Rodgers is a 57 y.o. female presenting on 01/03/2018 for Mouth feels dry, feels like something in throat (reports when she eats anything spicy she feels like food is stuck in throat, almost can smell food)  Pt presents today with complaints of a "tickle" in her throat. She states it feels as if something is in her throat. She states this has been ongoing for 2 months. She denies fever, chills, headache, sore throat, or rhinorrhea. Pt reports she gets "heartburn" at least 5 times per week, states worse after certain meals or food. Pt states she does not take anything for the heartburn. Pt reports she does drink caffeine on a regular basis and this exacerbates the symptoms. Denies chest pain, shortness of breath, or other associated symptoms.   1. GERD without esophagitis      Relevant past medical, surgical, family and social history reviewed and updated as indicated. Interim medical history since our last visit reviewed. Allergies and medications reviewed and updated. DATA REVIEWED: CHART IN EPIC  Family History reviewed for pertinent findings.  Past Medical History:  Diagnosis Date  . Depression    no meds  . Hyperlipidemia   . Hypertension   . Stroke [facility])    march 2015    Past Surgical History:  Procedure Laterality Date  . INCONTINENCE SURGERY    . SVD     x 3  . TUBAL LIGATION      Social History   Socioeconomic History  . Marital status: Married    Spouse name: Not on file  . Number of children: Not on file  . Years of education: Not on file  . Highest education level: Not on file  Occupational History  . Occupation: homemaker  Social Needs  . Financial resource strain: Not on file   . Food insecurity:    Worry: Not on file    Inability: Not on file  . Transportation needs:    Medical: Not on file    Non-medical: Not on file  Tobacco Use  . Smoking status: Former Smoker    Packs/day: 1.50    Years: 21.00    Pack years: 31.50    Types: Cigarettes    Last attempt to quit: 04/18/1995    Years since quitting: 22.7  . Smokeless tobacco: Never Used  Substance and Sexual Activity  . Alcohol use: No  . Drug use: No  . Sexual activity: Yes    Birth control/protection: Surgical  Lifestyle  . Physical activity:    Days per week: Not on file    Minutes per session: Not on file  . Stress: Not on file  Relationships  . Social connections:    Talks on phone: Not on file    Gets together: Not on file    Attends religious service: Not on file    Active member of club or organization: Not on file    Attends meetings of clubs or organizations: Not on file    Relationship status: Not on file  . Intimate partner violence:    Fear of current or ex partner: Not on file    Emotionally abused: Not on file  Physically abused: Not on file    Forced sexual activity: Not on file  Other Topics Concern  . Not on file  Social History Narrative  . Not on file    Outpatient Encounter Medications as of 01/03/2018  Medication Sig  . rivaroxaban (XARELTO) 20 MG TABS tablet TAKE 1 TABLET DAILY WITH SUPPER  . rosuvastatin (CRESTOR) 10 MG tablet TAKE 1 TABLET BY MOUTH EVERY DAY IN THE EVENING  . telmisartan (MICARDIS) 40 MG tablet TAKE 1 TABLET (40 MG TOTAL) BY MOUTH DAILY.  . famotidine (PEPCID) 40 MG tablet Take 0.5 tablets (20 mg total) by mouth at bedtime.  . [DISCONTINUED] famotidine (PEPCID) 40 MG tablet Take 1 tablet (40 mg total) by mouth at bedtime.   No facility-administered encounter medications on file as of 01/03/2018.     No Known Allergies  Review of Systems  Constitutional: Negative for chills, fatigue and fever.  HENT: Negative for congestion, drooling, ear  pain, rhinorrhea, sinus pressure, sinus pain and sore throat.        Feels as if something is in throat.   Respiratory: Negative for cough, chest tightness and shortness of breath.   Gastrointestinal: Negative for abdominal pain, constipation, diarrhea, nausea and vomiting.       Heartburn at least 5 times per week   Musculoskeletal: Negative for neck pain.  Neurological: Negative for headaches.  All other systems reviewed and are negative.       Objective:    BP 129/80   Pulse 79   Temp 97.9 F (36.6 C) (Oral)   Ht 5\' 6"  (1.676 m)   Wt 182 lb (82.6 kg)   LMP 02/10/2013   BMI 29.38 kg/m    Wt Readings from Last 3 Encounters:  01/03/18 182 lb (82.6 kg)  07/03/17 183 lb (83 kg)  02/14/17 184 lb 3.2 oz (83.6 kg)    Physical Exam  Constitutional: She is oriented to person, place, and time. She appears well-developed and well-nourished.  HENT:  Head: Normocephalic.  Right Ear: Hearing, tympanic membrane, external ear and ear canal normal.  Left Ear: Hearing, tympanic membrane, external ear and ear canal normal.  Nose: Nose normal.  Mouth/Throat: Uvula is midline, oropharynx is clear and moist and mucous membranes are normal. No oropharyngeal exudate, posterior oropharyngeal edema, posterior oropharyngeal erythema or tonsillar abscesses. No tonsillar exudate.  Neck: Normal range of motion. Neck supple. No tracheal deviation present. No thyromegaly present.  Cardiovascular: Normal rate, regular rhythm and normal heart sounds.  Pulmonary/Chest: Effort normal and breath sounds normal.  Abdominal: Soft. Bowel sounds are normal.  Lymphadenopathy:    She has no cervical adenopathy.  Neurological: She is alert and oriented to person, place, and time.  Skin: Skin is warm and dry. Capillary refill takes less than 2 seconds.  Psychiatric: She has a normal mood and affect. Her behavior is normal. Judgment and thought content normal.  Nursing note and vitals reviewed.         Assessment & Plan:   1. GERD without esophagitis Avoid greasy, spicy foods. Limit caffeine intake. Do not eat for at least 2 hours prior to going to bed. Medications as prescribed.  - famotidine (PEPCID) 40 MG tablet; Take 0.5 tablets (20 mg total) by mouth at bedtime.  Dispense: 30 tablet; Refill: 3   Continue all other maintenance medications.  Follow up plan: Return in about 4 weeks (around 01/31/2018), or if symptoms worsen or fail to improve.  Educational handout given for GERD  The  above assessment and management plan was discussed with the patient. The patient verbalized understanding of and has agreed to the management plan. Patient is aware to call the clinic if symptoms persist or worsen. Patient is aware when to return to the clinic for a follow-up visit. Patient educated on when it is appropriate to go to the emergency department.   Kari Baars, FNP-C [facility] [phone]

## 2018-09-03 ENCOUNTER — Other Ambulatory Visit: Payer: Self-pay | Admitting: Obstetrics & Gynecology

## 2018-09-03 ENCOUNTER — Other Ambulatory Visit: Payer: Self-pay | Admitting: Obstetrics and Gynecology

## 2018-09-03 DIAGNOSIS — Z1231 Encounter for screening mammogram for malignant neoplasm of breast: Secondary | ICD-10-CM

## 2018-10-24 ENCOUNTER — Ambulatory Visit: Payer: BLUE CROSS/BLUE SHIELD

## 2018-12-02 ENCOUNTER — Other Ambulatory Visit: Payer: Self-pay

## 2018-12-02 ENCOUNTER — Ambulatory Visit
Admission: RE | Admit: 2018-12-02 | Discharge: 2018-12-02 | Disposition: A | Discharge status: Home / Self Care | Payer: PRIVATE HEALTH INSURANCE | Source: Ambulatory Visit | Attending: Obstetrics and Gynecology | Admitting: Obstetrics and Gynecology

## 2018-12-02 DIAGNOSIS — Z1231 Encounter for screening mammogram for malignant neoplasm of breast: Secondary | ICD-10-CM

## 2019-01-06 ENCOUNTER — Other Ambulatory Visit: Payer: Self-pay | Admitting: *Deleted

## 2019-01-06 DIAGNOSIS — Z20822 Contact with and (suspected) exposure to covid-19: Secondary | ICD-10-CM

## 2019-01-07 LAB — NOVEL CORONAVIRUS, NAA: SARS-CoV-2, NAA: DETECTED — AB

## 2019-04-20 ENCOUNTER — Other Ambulatory Visit: Payer: Self-pay | Admitting: Internal Medicine

## 2019-04-20 NOTE — Telephone Encounter (Signed)
New Message      *STAT* If patient is at the pharmacy, call can be transferred to refill team.   1. Which medications need to be refilled? (please list name of each medication and dose if known) rosuvastatin (CRESTOR) 10 MG tablet  2. Which pharmacy/location (including street and city if local pharmacy) is medication to be sent to? [id number] - [place], [place] - [address]  3. Do they need a 30 day or 90 day supply? 30

## 2019-04-21 MED ORDER — ROSUVASTATIN CALCIUM 10 MG PO TABS: ORAL_TABLET | ORAL | 0 refills | Status: DC

## 2019-04-21 NOTE — Telephone Encounter (Signed)
Rx has been sent to the pharmacy electronically. #30 Appt was made on 02/01 with Dr Rennis Golden

## 2019-05-15 ENCOUNTER — Other Ambulatory Visit: Payer: Self-pay | Admitting: Internal Medicine

## 2019-05-18 ENCOUNTER — Ambulatory Visit (INDEPENDENT_AMBULATORY_CARE_PROVIDER_SITE_OTHER): Payer: BC Managed Care – PPO | Admitting: Internal Medicine

## 2019-05-18 ENCOUNTER — Encounter: Payer: Self-pay | Admitting: Internal Medicine

## 2019-05-18 ENCOUNTER — Other Ambulatory Visit: Payer: Self-pay

## 2019-05-18 VITALS — BP 147/98 | HR 81 | Ht 66.0 in | Wt 181.0 lb

## 2019-05-18 DIAGNOSIS — Z7901 Long term (current) use of anticoagulants: Secondary | ICD-10-CM

## 2019-05-18 DIAGNOSIS — E785 Hyperlipidemia, unspecified: Secondary | ICD-10-CM

## 2019-05-18 DIAGNOSIS — I4891 Unspecified atrial fibrillation: Secondary | ICD-10-CM

## 2019-05-18 DIAGNOSIS — I10 Essential (primary) hypertension: Secondary | ICD-10-CM

## 2019-05-18 LAB — COMPREHENSIVE METABOLIC PANEL
ALT: 17 IU/L (ref 0–32)
AST: 13 IU/L (ref 0–40)
Albumin/Globulin Ratio: 1.5 (ref 1.2–2.2)
Albumin: 4.3 g/dL (ref 3.8–4.9)
Alkaline Phosphatase: 104 IU/L (ref 39–117)
BUN/Creatinine Ratio: 16 (ref 12–28)
BUN: 13 mg/dL (ref 8–27)
Bilirubin Total: 0.5 mg/dL (ref 0.0–1.2)
CO2: 24 mmol/L (ref 20–29)
Calcium: 9.4 mg/dL (ref 8.7–10.3)
Chloride: 105 mmol/L (ref 96–106)
Creatinine, Ser: 0.82 mg/dL (ref 0.57–1.00)
GFR calc Af Amer: 90 mL/min/{1.73_m2} (ref >59)
GFR calc non Af Amer: 78 mL/min/{1.73_m2} (ref >59)
Globulin, Total: 2.9 g/dL (ref 1.5–4.5)
Glucose: 100 mg/dL — ABNORMAL HIGH (ref 65–99)
Potassium: 4.6 mmol/L (ref 3.5–5.2)
Sodium: 142 mmol/L (ref 134–144)
Total Protein: 7.2 g/dL (ref 6.0–8.5)

## 2019-05-18 LAB — LIPID PANEL
Chol/HDL Ratio: 3.6 ratio (ref 0.0–4.4)
Cholesterol, Total: 139 mg/dL (ref 100–199)
HDL: 39 mg/dL — ABNORMAL LOW (ref >39)
LDL Chol Calc (NIH): 73 mg/dL (ref 0–99)
Triglycerides: 158 mg/dL — ABNORMAL HIGH (ref 0–149)
VLDL Cholesterol Cal: 27 mg/dL (ref 5–40)

## 2019-05-18 LAB — CBC
Hematocrit: 39.7 % (ref 34.0–46.6)
Hemoglobin: 13.8 g/dL (ref 11.1–15.9)
MCH: 30.5 pg (ref 26.6–33.0)
MCHC: 34.8 g/dL (ref 31.5–35.7)
MCV: 88 fL (ref 79–97)
Platelets: 211 10*3/uL (ref 150–450)
RBC: 4.52 x10E6/uL (ref 3.77–5.28)
RDW: 12.1 % (ref 11.7–15.4)
WBC: 4.1 10*3/uL (ref 3.4–10.8)

## 2019-05-18 LAB — TSH: TSH: 2.01 u[IU]/mL (ref 0.450–4.500)

## 2019-05-18 MED ORDER — TELMISARTAN 40 MG PO TABS: ORAL_TABLET | ORAL | 3 refills | Status: DC

## 2019-05-18 MED ORDER — RIVAROXABAN 20 MG PO TABS: ORAL_TABLET | ORAL | 3 refills | Status: DC

## 2019-05-18 NOTE — Patient Instructions (Signed)
Medication Instructions:  RESUME xarelto & micardis  *If you need a refill on your cardiac medications before your next appointment, please call your pharmacy*  Lab Work: LAB WORK TODAY - lipid, TSH, CBC, CMET If you have labs (blood work) drawn today and your tests are completely normal, you will receive your results only by: Marland Kitchen MyChart Message (if you have MyChart) OR . A paper copy in the mail If you have any lab test that is abnormal or we need to change your treatment, we will call you to review the results.  Testing/Procedures: NONE  Follow-Up: At [facility], you and your health needs are our priority.  As part of our continuing mission to provide you with exceptional heart care, we have created designated Provider Care Teams.  These Care Teams include your primary Cardiologist (physician) and Advanced Practice Providers (APPs -  Physician Assistants and Nurse Practitioners) who all work together to provide you with the care you need, when you need it.  Your next appointment:   12 month(s)  The format for your next appointment:   In Person  Provider:   You may see Dr. Rennis Golden or one of the following Advanced Practice Providers on your designated Care Team:    Azalee Course, PA-C  Micah Flesher, [place] or   Judy Pimple, [place]   Other Instructions

## 2019-05-18 NOTE — Progress Notes (Signed)
OFFICE NOTE  Chief Complaint:  Routine follow-up  Primary Care Physician: Baruch Gouty, FNP  HPI:  Kathleen Rodgers is a pleasant 59 year old female who unfortunately recently suffered a stroke when she was visiting her son who was in [place]. She developed acute onset left-sided weakness and trouble speaking and presented to emergently to a local hospital. She was then rapidly transferred to a larger hospital, apparently by which time her symptoms had improved. She did not receive TPA. She underwent a thorough workup including carotid Dopplers, head CT and MRI, echocardiogram and TEE. She was also found to have a short period of atrial fibrillation on telemetry and her TEE was negative for thrombus. 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 Her venous Dopplers were performed indicating no evidence of DVT.  She was placed on projects along with other rate controlling medications and has Rodgers fairly well.  She does have a compelling family history for heart disease as her mother had her first urge activities 81 and her grandfather had a stroke. She is currently asymptomatic and has regained basically full recovery.  I had the pleasure of seeing Kathleen Rodgers back in the office today. Since I last saw her she stopped her metoprolol due to complaints of fatigue and reports that it is improved slightly. She's had no further atrial fibrillation. She does get some shortness of breath with exertion but also has had weight gain. She reports some depression and lack of interest in exercise. She is not particularly active. She is having no problems on Pradaxa with regards to bleeding. She recently stopped her Crestor for unknown reasons and a lipid profile showed a marked elevation in cholesterol with total cholesterol 238 and LDL of 158. I agree with restarting her Crestor and she will likely require a higher dose aced on a goal LDL less than 70 and aggressive therapy given her  history of stroke.  09/02/2015  Ms. [name] returns today for follow-up. This is an annual visit. She reports over the past year she's Rodgers very well. She's not aware of any recurrent atrial fibrillation. She is tolerating Pradaxa however due to changes in her insurance, they are suggesting she switched to Xarelto. She is on Crestor and due for repeat lipid profile.  02/14/2017  Kathleen Rodgers returns today for routine follow-up.  She denies any recurrent atrial fibrillation that she is aware of.  She has had no further stroke or TIA events.  Her blood pressure is well controlled.  She has dyslipidemia on Crestor but has not had a repeat lipid profile in a year.  She suffered from depression in the past but seems to be doing well.  05/18/2019  Kathleen Rodgers is seen today for follow-up.  Its been just over 2 years since I last saw her.  She reports over the last year she has been out of her Xarelto.  She did not seem to think that she needed to continue it.  She says her husband had really good insurance but lost it and I think her concern was the cost of the medication.  I reiterated the fact that she should be on lifelong anticoagulation due to her history of paroxysmal atrial fibrillation and prior stroke.  Her chads vas score is 5 which puts her at increased risk of recurrent stroke.  Additionally, she had had optimally controlled hypertension however blood pressure is noted today at 147/98.  She was not taking her telmisartan either.  PMHx:  Past Medical History:  Diagnosis Date  . Depression    no meds  . Hyperlipidemia   . Hypertension   . Stroke [facility])    march 2015    Past Surgical History:  Procedure Laterality Date  . INCONTINENCE SURGERY    . SVD     x 3  . TUBAL LIGATION      FAMHx:  Family History  Problem Relation Age of Onset  . Heart disease Mother   . Heart attack Mother 31  . Throat cancer Father   . Breast cancer Sister 48  . Breast cancer Other        niece  .  Breast cancer Paternal Aunt     SOCHx:   reports that she quit smoking about 24 years ago. Her smoking use included cigarettes. She has a 31.50 pack-year smoking history. She has never used smokeless tobacco. She reports that she does not drink alcohol or use drugs.  ALLERGIES:  No Known Allergies  ROS: Pertinent items noted in HPI and remainder of comprehensive ROS otherwise negative.  HOME MEDS: Current Outpatient Medications  Medication Sig Dispense Refill  . rosuvastatin (CRESTOR) 10 MG tablet TAKE 1 TABLET BY MOUTH EVERY DAY IN THE EVENING 30 tablet 0   No current facility-administered medications for this visit.    LABS/IMAGING: No results found for this or any previous visit (from the past 48 hour(s)). No results found.  VITALS: BP (!) 147/98   Pulse 81   Ht 5\' 6"  (1.676 m)   Wt 181 lb (82.1 kg)   LMP 02/10/2013   SpO2 96%   BMI 29.21 kg/m   EXAM: General appearance: alert and no distress Neck: no carotid bruit and no JVD Lungs: clear to auscultation bilaterally Heart: regular rate and rhythm, S1, S2 normal, no murmur, click, rub or gallop Abdomen: soft, non-tender; bowel sounds normal; no masses,  no organomegaly Extremities: extremities normal, atraumatic, no cyanosis or edema Pulses: 2+ and symmetric Skin: Skin color, texture, turgor normal. No rashes or lesions Neurologic: Grossly normal Psych: Mood, affect normal  EKG: Normal sinus rhythm at 81-personally reviewed  ASSESSMENT: 1. History of cardioembolic stroke 2. Paroxysmal atrial fibrillation 3. CHADSVASC score of 5 4. Hypertension-controlled 5. Dyslipidemia- on crestor 6. Depression  PLAN: 1.   Kathleen Rodgers has a history of stroke and paroxysmal atrial fibrillation.  Fortunately she is in sinus today however her stroke risk persists.  I would like for her to restart Xarelto.  Additionally, she has been off of her blood pressure medicine and it shows.  Her blood pressure is elevated today.  She  says she has checked it periodically at home and it was "normal".  Plan to restart her telmisartan.  We will also check labs today as she has been compliant with her rosuvastatin.  Follow-up annually or sooner as necessary.  Neldon Labella, MD, [facility], FACP  [facility]  Medical Director of the [facility] Lipid Disorders &  Cardiovascular Risk Reduction Clinic Diplomate of the American Board of Clinical Lipidology Attending Cardiologist  Direct Dial: [phone]  Fax: [phone]  Website:  [email or web address] Naimah Yingst 05/18/2019, 8:13 AM

## 2019-06-08 ENCOUNTER — Other Ambulatory Visit: Payer: Self-pay | Admitting: Internal Medicine

## 2019-11-03 ENCOUNTER — Other Ambulatory Visit: Payer: Self-pay | Admitting: Obstetrics and Gynecology

## 2019-11-03 DIAGNOSIS — Z1231 Encounter for screening mammogram for malignant neoplasm of breast: Secondary | ICD-10-CM

## 2019-12-03 ENCOUNTER — Other Ambulatory Visit: Payer: Self-pay | Admitting: Obstetrics and Gynecology

## 2019-12-03 ENCOUNTER — Ambulatory Visit
Admission: RE | Admit: 2019-12-03 | Discharge: 2019-12-03 | Disposition: A | Discharge status: Home / Self Care | Payer: BC Managed Care – PPO | Source: Ambulatory Visit | Attending: Obstetrics and Gynecology | Admitting: Obstetrics and Gynecology

## 2019-12-03 DIAGNOSIS — Z1231 Encounter for screening mammogram for malignant neoplasm of breast: Secondary | ICD-10-CM | POA: Diagnosis not present

## 2020-06-15 ENCOUNTER — Other Ambulatory Visit: Payer: Self-pay | Admitting: Internal Medicine

## 2020-06-15 NOTE — Telephone Encounter (Signed)
40f, 82.1kg, lovw/hilty 05/18/19, Creatinine, Serum 0.820 mg/ 2/1/2021ccr 93

## 2020-06-21 ENCOUNTER — Ambulatory Visit: Payer: BLUE CROSS/BLUE SHIELD | Admitting: Family Medicine

## 2020-06-23 ENCOUNTER — Ambulatory Visit: Payer: BC Managed Care – PPO | Admitting: Internal Medicine

## 2020-09-20 ENCOUNTER — Other Ambulatory Visit: Payer: Self-pay | Admitting: Internal Medicine

## 2020-12-05 ENCOUNTER — Other Ambulatory Visit: Payer: Self-pay | Admitting: Internal Medicine

## 2020-12-05 NOTE — Telephone Encounter (Signed)
Prescription refill request for Xarelto received.  Indication:afib Last office visit:hilty 05/18/19 Weight:82.1kg Age:60 Scr:0.84 07/01/20 CrCl:90

## 2020-12-21 ENCOUNTER — Other Ambulatory Visit: Payer: Self-pay | Admitting: Internal Medicine

## 2021-01-03 ENCOUNTER — Other Ambulatory Visit: Payer: Self-pay | Admitting: Internal Medicine

## 2021-05-06 ENCOUNTER — Other Ambulatory Visit: Payer: Self-pay | Admitting: Internal Medicine

## 2021-05-28 IMAGING — MG DIGITAL SCREENING BILATERAL MAMMOGRAM WITH TOMO AND CAD
8 series · 8 of 24 positions shown · non-contrast
Comparison: Previous exam(s).

CLINICAL DATA: Screening.

EXAM:
DIGITAL SCREENING BILATERAL MAMMOGRAM WITH TOMO AND CAD

[R CC synth-2D]
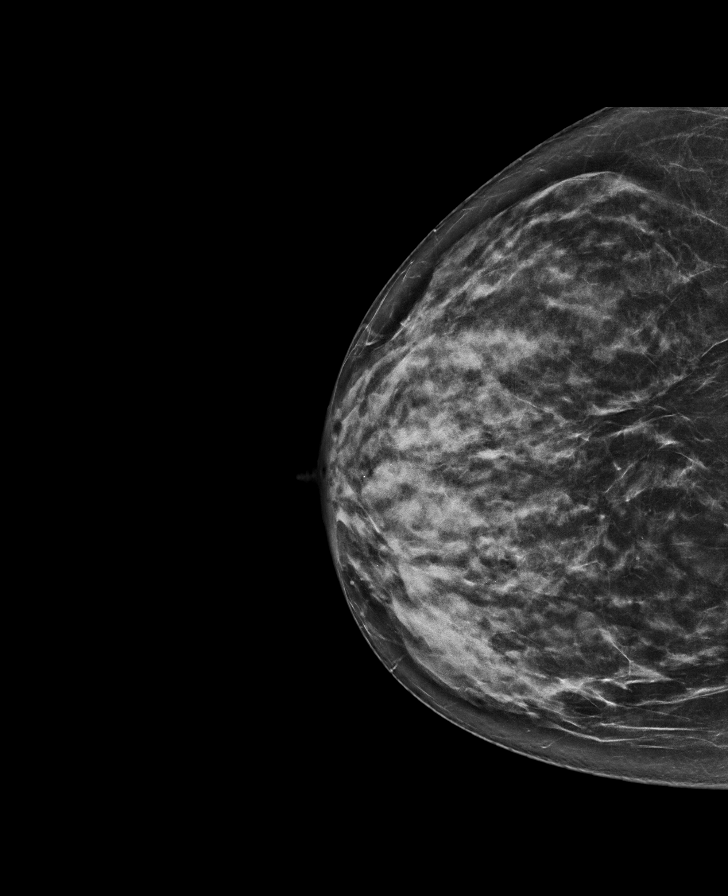

[L MLO synth-2D]
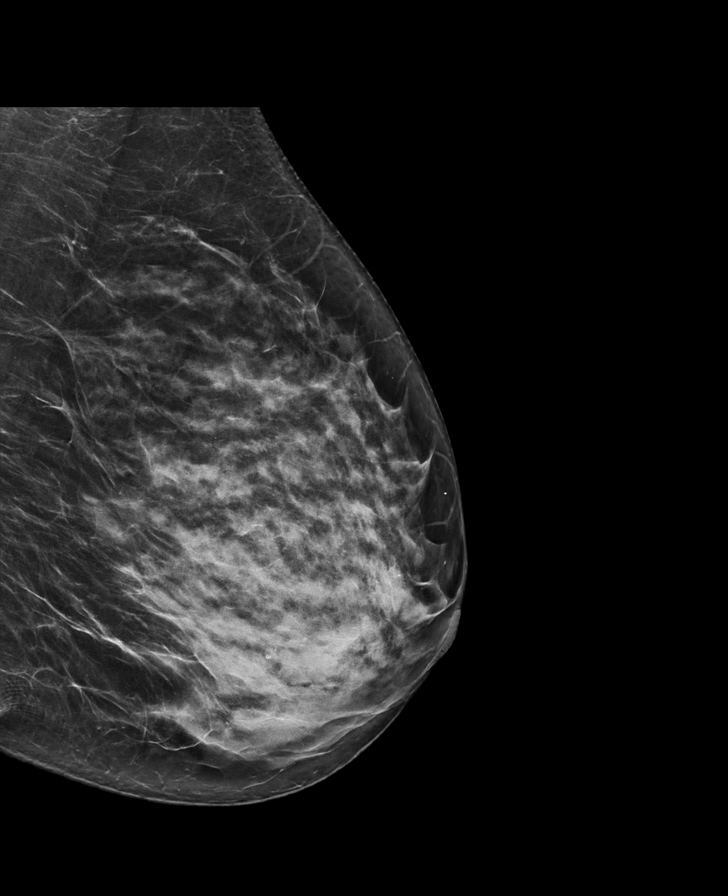

[R MLO synth-2D]
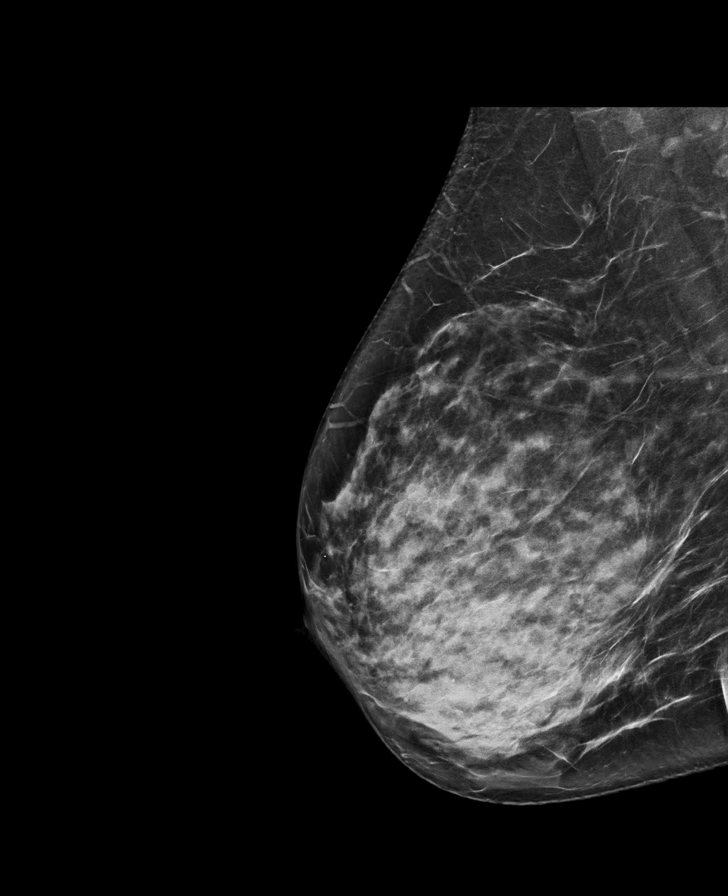

[L CC synth-2D]
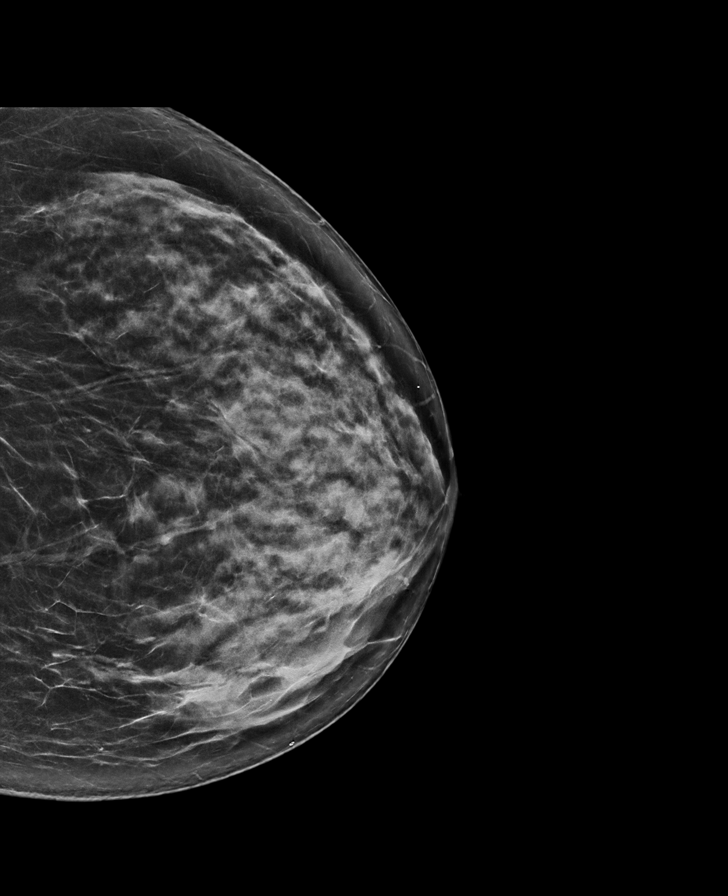

[L CC tomo · tomo slice 37/72.0]
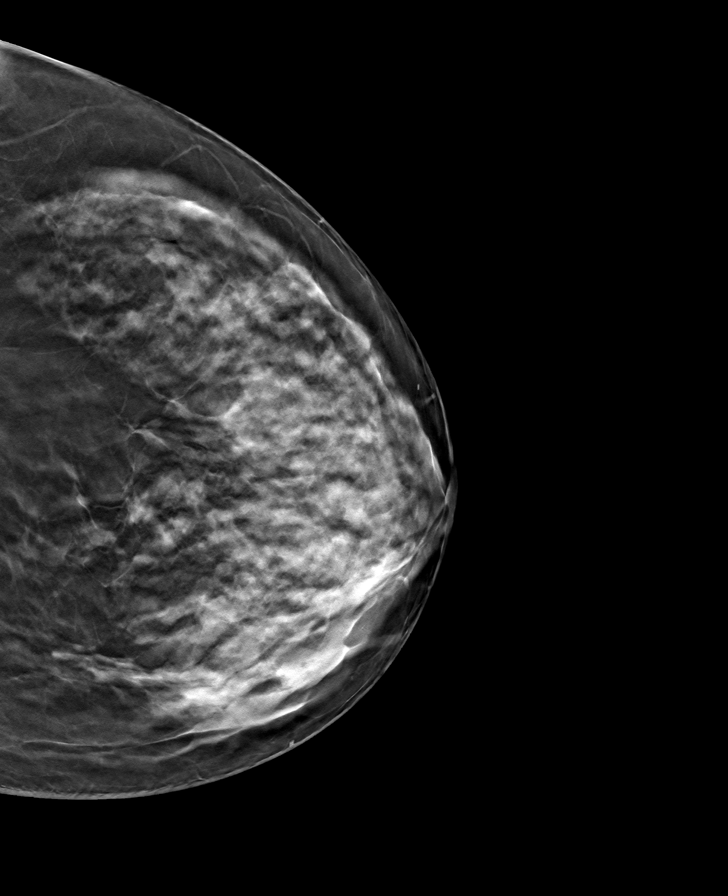

[R MLO tomo · tomo slice 40/79.0]
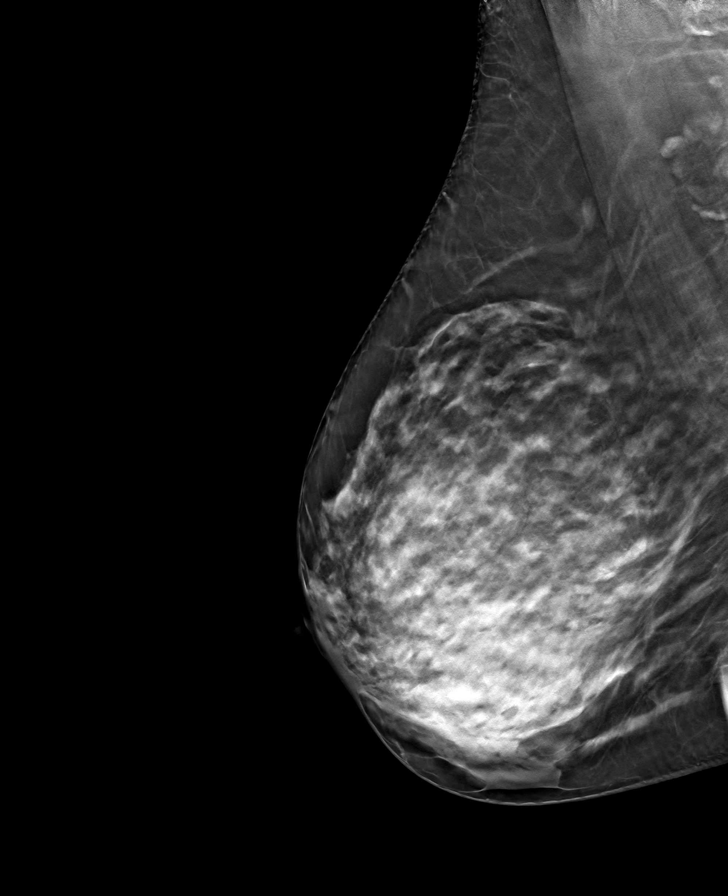

[L MLO tomo · tomo slice 39/76.0]
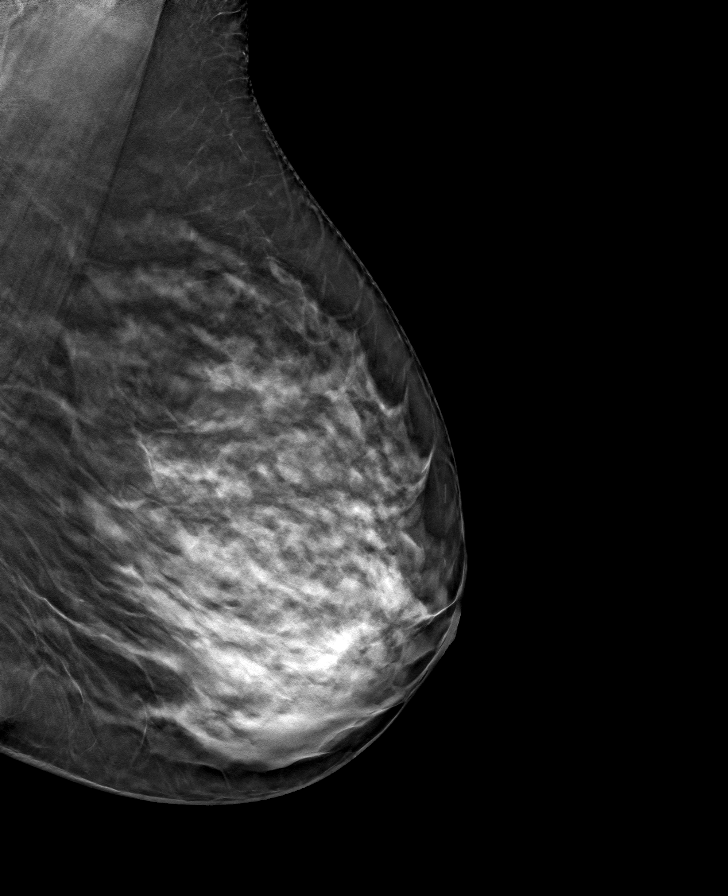

[R CC tomo · tomo slice 35/70.0]
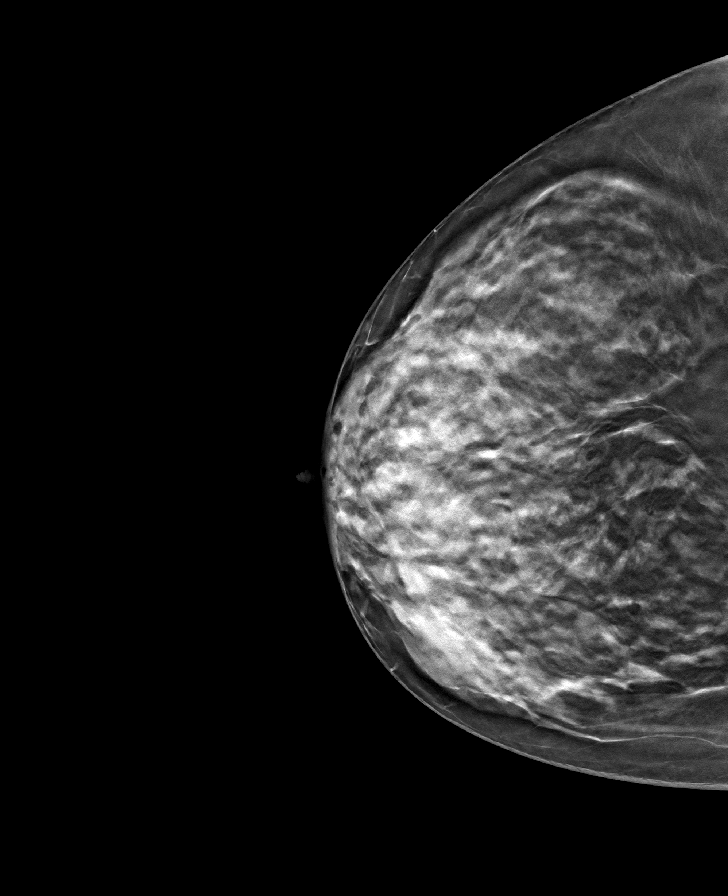

[8 of 24 positions shown; findings below may reference images not displayed]

ACR Breast Density Category c: The breast tissue is heterogeneously
dense, which may obscure small masses.
FINDINGS: There are no findings suspicious for malignancy. Images were
processed with CAD.
IMPRESSION: No mammographic evidence of malignancy. A result letter of this
screening mammogram will be mailed directly to the patient.

RECOMMENDATION:
Screening mammogram in one year. (Code:[id number])

BI-RADS CATEGORY  1: Negative.

## 2021-06-05 ENCOUNTER — Other Ambulatory Visit: Payer: Self-pay | Admitting: Internal Medicine

## 2021-06-24 ENCOUNTER — Other Ambulatory Visit: Payer: Self-pay | Admitting: Internal Medicine

## 2021-07-25 ENCOUNTER — Other Ambulatory Visit: Payer: Self-pay | Admitting: Internal Medicine

## 2021-11-29 ENCOUNTER — Other Ambulatory Visit: Payer: Self-pay | Admitting: Internal Medicine

## 2021-12-07 ENCOUNTER — Other Ambulatory Visit: Payer: Self-pay | Admitting: Internal Medicine

## 2022-01-05 ENCOUNTER — Other Ambulatory Visit: Payer: Self-pay | Admitting: Internal Medicine

## 2022-01-08 NOTE — Telephone Encounter (Signed)
Prescription refill request for Xarelto received.  Indication: Afib  Last office visit: [place], 05/18/2019 Weight: 82.1  Age: 61  Scr:.80, 09/14/2021 CrCl:   Pt has not seen Dr. Debara Pickett since 2021. Per care everywhere pt is seeing a new cardiologist at [facility]. Will refuse refill and request pharmacy send prescription to pt's new cardiologist.

## 2022-01-18 ENCOUNTER — Other Ambulatory Visit: Payer: Self-pay | Admitting: Internal Medicine

## 2022-01-29 ENCOUNTER — Other Ambulatory Visit: Payer: Self-pay | Admitting: Internal Medicine
